# Patient Record
Sex: Male | Born: 1985
Health system: Southern US, Community
[De-identification: ages and names within clinical notes are randomized; demographics above are authoritative.]

## PROBLEM LIST (undated history)

## (undated) DIAGNOSIS — E119 Type 2 diabetes mellitus without complications: Secondary | ICD-10-CM

## (undated) HISTORY — PX: CLOSED REDUCTION HAND FRACTURE: SHX973

---
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
## 2017-08-30 ENCOUNTER — Ambulatory Visit (INDEPENDENT_AMBULATORY_CARE_PROVIDER_SITE_OTHER): Payer: 59 | Admitting: Physician Assistant

## 2017-08-30 ENCOUNTER — Encounter: Payer: Self-pay | Admitting: Physician Assistant

## 2017-08-30 VITALS — BP 126/88 | HR 72 | Temp 98.5°F | Resp 16 | Ht 66.0 in | Wt 183.0 lb

## 2017-08-30 DIAGNOSIS — Z131 Encounter for screening for diabetes mellitus: Secondary | ICD-10-CM

## 2017-08-30 DIAGNOSIS — R7309 Other abnormal glucose: Secondary | ICD-10-CM

## 2017-08-30 DIAGNOSIS — E119 Type 2 diabetes mellitus without complications: Secondary | ICD-10-CM | POA: Diagnosis not present

## 2017-08-30 MED ORDER — INSULIN GLARGINE 100 UNIT/ML SOLOSTAR PEN: 10.0000 [IU] | PEN_INJECTOR | Freq: Every day | SUBCUTANEOUS | 1 refills | Status: DC

## 2017-08-30 MED ORDER — INSULIN PEN NEEDLE 32G X 4 MM MISC: 0 refills | Status: DC

## 2017-08-30 NOTE — Progress Notes (Signed)
Patient: Garrett Morse Male    DOB: 16-Dec-1985   31 y.o.   MRN: [id number] Visit Date: 09/04/2017  Today's Provider: [name], PA-C   Chief Complaint  Patient presents with  . Establish Care  . Diabetes   Subjective:    Garrett Morse is a 32 y/o man presenting today to establish care. Has no prior PCP. He works with [employer]. Living in [place]. Mom and grandmother with Type II DM, mother diagnosed early thirties.  Main concern today is that two years ago he had surgery on his hand. He was told prior to surgery that his sugar was high and he needed insulin. He says this was not addressed further. He reports today that he experiences increased thirst and urination. Has frequent mood swings. He report he has lost weight, previously used to be 250 lbs two years ago. He does report his eating habits are not great, eats frequent fast food.   Diabetes  He presents for his follow-up diabetic visit. He has type 2 diabetes mellitus. There are no hypoglycemic associated symptoms. There are no diabetic associated symptoms. There are no hypoglycemic complications. Symptoms are stable. Risk factors for coronary artery disease include family history and male sex. Current diabetic treatment includes diet (Pt reports his highest weight was around 250 about three years ago. ). He is compliant with treatment all of the time. He is following a diabetic diet. He participates in exercise three times a week. An ACE inhibitor/angiotensin II receptor blocker is not being taken. He does not see a podiatrist.Eye exam is current.   Surgery 2016 march REX Mom, grandmother diabetes      Allergies  Allergen Reactions  . Penicillins Hives     Current Outpatient Medications:  .  fexofenadine (ALLEGRA ALLERGY) 180 MG tablet, Take 180 mg by mouth daily., Disp: , Rfl:  .  Insulin Glargine (LANTUS SOLOSTAR) 100 UNIT/ML Solostar Pen, Inject 10 Units into the skin daily at 10 pm., Disp: 5 pen,  Rfl: 1 .  Insulin Pen Needle 32G X 4 MM MISC, Use one needle per nightly injection, Disp: 90 each, Rfl: 0 .  metFORMIN (GLUCOPHAGE) 500 MG tablet, Take 500 mg daily x 1 wk. Take 500 mg 2x daily x 1 wk. Take 1000 mg in the morning and 500 mg at night x 1 wk. Then take 1000 mg 2x daily., Disp: 180 tablet, Rfl: 0  Review of Systems   12 point ROS reviewed and remainder of systems negative but for pertinent listed in HPI.   Social History   Tobacco Use  . Smoking status: Never Smoker  . Smokeless tobacco: Never Used  Substance Use Topics  . Alcohol use: Yes    Comment: Rarely   Objective:   BP 126/88 (BP Location: Left Arm, Patient Position: Sitting, Cuff Size: Normal)   Pulse 72   Temp 98.5 F (36.9 C) (Oral)   Resp 16   Ht 5\' 6"  (1.676 m)   Wt 183 lb (83 kg)   BMI 29.54 kg/m  Vitals:   08/30/17 1520  BP: 126/88  Pulse: 72  Resp: 16  Temp: 98.5 F (36.9 C)  TempSrc: Oral  Weight: 183 lb (83 kg)  Height: 5\' 6"  (1.676 m)     Physical Exam  Constitutional: He is oriented to person, place, and time. He appears well-developed and well-nourished.  HENT:  Head: Normocephalic and atraumatic.  Right Ear: External ear normal.  Left Ear: External ear normal.  Eyes: Pupils are equal, round, and reactive to light. Conjunctivae are normal.  Neck: Neck supple.  Cardiovascular: Normal rate and regular rhythm.  Pulmonary/Chest: Effort normal and breath sounds normal.  Abdominal: Soft. Bowel sounds are normal.  Lymphadenopathy:    He has no cervical adenopathy.  Neurological: He is alert and oriented to person, place, and time.  Skin: Skin is warm and dry.  Psychiatric: He has a normal mood and affect. His behavior is normal.        Assessment & Plan:     1. Diabetes mellitus screening  A1c today in office is >14 on our machine. Patient is not in distress, appears well. Suspect component of his weight loss due to elevated sugars. Unsure if he is late onset Type I or Type II  DM. Will start on 10 units of Lantus at night, demonstrated in the office and sample provided. Will get C-peptide to further assess and labs below. Have provided him hard script for glucometer, strips and lancets. Would like him to check fasting sugars daily and bring log to next visit.  2. Elevated glucose  - C-peptide - POCT HgB A1C  3. Type 2 diabetes mellitus without complication, without long-term current use of insulin (HCC)  - CBC With Differential - Lipid Profile - Comprehensive Metabolic Panel (CMET) - CBC with Differential - Urine Microalbumin w/creat. ratio - HgB A1c - Insulin Pen Needle 32G X 4 MM MISC; Use one needle per nightly injection  Dispense: 90 each; Refill: 0  Return in about 4 days (around 09/03/2017) for Type II DM.  The entirety of the information documented in the History of Present Illness, Review of Systems and Physical Exam were personally obtained by me. Portions of this information were initially documented by Kavin LeechLaura Walsh, CMA and reviewed by me for thoroughness and accuracy.   I have spent 45 minutes with this patient, >30% of which was spent on counseling and coordination of care.       Trey SailorsAdriana M Able Malloy, PA-C  [facility]

## 2017-08-30 NOTE — Patient Instructions (Signed)

## 2017-08-31 LAB — CBC WITH DIFFERENTIAL/PLATELET
Basophils Absolute: 0 10*3/uL (ref 0.0–0.2)
Basos: 1 %
EOS (ABSOLUTE): 0 10*3/uL (ref 0.0–0.4)
Eos: 1 %
Hematocrit: 43.9 % (ref 37.5–51.0)
Hemoglobin: 14.8 g/dL (ref 13.0–17.7)
Immature Grans (Abs): 0 10*3/uL (ref 0.0–0.1)
Immature Granulocytes: 0 %
Lymphocytes Absolute: 3.4 10*3/uL — ABNORMAL HIGH (ref 0.7–3.1)
Lymphs: 49 %
MCH: 29 pg (ref 26.6–33.0)
MCHC: 33.7 g/dL (ref 31.5–35.7)
MCV: 86 fL (ref 79–97)
Monocytes Absolute: 0.3 10*3/uL (ref 0.1–0.9)
Monocytes: 4 %
Neutrophils Absolute: 3 10*3/uL (ref 1.4–7.0)
Neutrophils: 45 %
Platelets: 312 10*3/uL (ref 150–379)
RBC: 5.1 x10E6/uL (ref 4.14–5.80)
RDW: 13.6 % (ref 12.3–15.4)
WBC: 6.8 10*3/uL (ref 3.4–10.8)

## 2017-08-31 LAB — LIPID PANEL
Chol/HDL Ratio: 4.9 ratio (ref 0.0–5.0)
Cholesterol, Total: 315 mg/dL — ABNORMAL HIGH (ref 100–199)
HDL: 64 mg/dL (ref >39)
LDL Calculated: 206 mg/dL — ABNORMAL HIGH (ref 0–99)
Triglycerides: 224 mg/dL — ABNORMAL HIGH (ref 0–149)
VLDL Cholesterol Cal: 45 mg/dL — ABNORMAL HIGH (ref 5–40)

## 2017-08-31 LAB — COMPREHENSIVE METABOLIC PANEL
ALT: 35 IU/L (ref 0–44)
AST: 32 IU/L (ref 0–40)
Albumin/Globulin Ratio: 1.4 (ref 1.2–2.2)
Albumin: 4.5 g/dL (ref 3.5–5.5)
Alkaline Phosphatase: 123 IU/L — ABNORMAL HIGH (ref 39–117)
BUN/Creatinine Ratio: 12 (ref 9–20)
BUN: 10 mg/dL (ref 6–20)
Bilirubin Total: 0.3 mg/dL (ref 0.0–1.2)
CO2: 23 mmol/L (ref 20–29)
Calcium: 9.5 mg/dL (ref 8.7–10.2)
Chloride: 95 mmol/L — ABNORMAL LOW (ref 96–106)
Creatinine, Ser: 0.86 mg/dL (ref 0.76–1.27)
GFR calc Af Amer: 135 mL/min/{1.73_m2} (ref >59)
GFR calc non Af Amer: 116 mL/min/{1.73_m2} (ref >59)
Globulin, Total: 3.2 g/dL (ref 1.5–4.5)
Glucose: 341 mg/dL — ABNORMAL HIGH (ref 65–99)
Potassium: 3.8 mmol/L (ref 3.5–5.2)
Sodium: 137 mmol/L (ref 134–144)
Total Protein: 7.7 g/dL (ref 6.0–8.5)

## 2017-08-31 LAB — MICROALBUMIN / CREATININE URINE RATIO
Creatinine, Urine: 57.3 mg/dL
Microalb/Creat Ratio: 5.8 mg/g creat (ref 0.0–30.0)
Microalbumin, Urine: 3.3 ug/mL

## 2017-08-31 LAB — HEMOGLOBIN A1C
Est. average glucose Bld gHb Est-mCnc: >398 mg/dL
Hgb A1c MFr Bld: >15.5 % — ABNORMAL HIGH (ref 4.8–5.6)

## 2017-08-31 LAB — C-PEPTIDE: C-Peptide: 1.1 ng/mL (ref 1.1–4.4)

## 2017-09-03 ENCOUNTER — Ambulatory Visit (INDEPENDENT_AMBULATORY_CARE_PROVIDER_SITE_OTHER): Payer: 59 | Admitting: Physician Assistant

## 2017-09-03 VITALS — BP 118/80 | HR 82 | Temp 98.4°F | Resp 14 | Wt 185.0 lb

## 2017-09-03 DIAGNOSIS — E1165 Type 2 diabetes mellitus with hyperglycemia: Secondary | ICD-10-CM | POA: Diagnosis not present

## 2017-09-03 DIAGNOSIS — E785 Hyperlipidemia, unspecified: Secondary | ICD-10-CM

## 2017-09-03 DIAGNOSIS — Z23 Encounter for immunization: Secondary | ICD-10-CM

## 2017-09-03 MED ORDER — METFORMIN HCL 500 MG PO TABS: ORAL_TABLET | ORAL | 0 refills | Status: DC

## 2017-09-03 NOTE — Patient Instructions (Addendum)
Dilated Diabetic Eye Exam at [facility]         Diabetes Mellitus and Exercise Exercising regularly is important for your overall health, especially when you have diabetes (diabetes mellitus). Exercising is not only about losing weight. It has many health benefits, such as increasing muscle strength and bone density and reducing body fat and stress. This leads to improved fitness, flexibility, and endurance, all of which result in better overall health. Exercise has additional benefits for people with diabetes, including:  Reducing appetite.  Helping to lower and control blood glucose.  Lowering blood pressure.  Helping to control amounts of fatty substances (lipids) in the blood, such as cholesterol and triglycerides.  Helping the body to respond better to insulin (improving insulin sensitivity).  Reducing how much insulin the body needs.  Decreasing the risk for heart disease by: ? Lowering cholesterol and triglyceride levels. ? Increasing the levels of good cholesterol. ? Lowering blood glucose levels.  What is my activity plan? Your health care provider or certified diabetes educator can help you make a plan for the type and frequency of exercise (activity plan) that works for you. Make sure that you:  Do at least 150 minutes of moderate-intensity or vigorous-intensity exercise each week. This could be brisk walking, biking, or water aerobics. ? Do stretching and strength exercises, such as yoga or weightlifting, at least 2 times a week. ? Spread out your activity over at least 3 days of the week.  Get some form of physical activity every day. ? Do not go more than 2 days in a row without some kind of physical activity. ? Avoid being inactive for more than 90 minutes at a time. Take frequent breaks to walk or stretch.  Choose a type of exercise or activity that you enjoy, and set realistic goals.  Start slowly, and gradually increase the intensity of your exercise  over time.  What do I need to know about managing my diabetes?  Check your blood glucose before and after exercising. ? If your blood glucose is higher than 240 mg/dL (16.1 mmol/L) before you exercise, check your urine for ketones. If you have ketones in your urine, do not exercise until your blood glucose returns to normal.  Know the symptoms of low blood glucose (hypoglycemia) and how to treat it. Your risk for hypoglycemia increases during and after exercise. Common symptoms of hypoglycemia can include: ? Hunger. ? Anxiety. ? Sweating and feeling clammy. ? Confusion. ? Dizziness or feeling light-headed. ? Increased heart rate or palpitations. ? Blurry vision. ? Tingling or numbness around the mouth, lips, or tongue. ? Tremors or shakes. ? Irritability.  Keep a rapid-acting carbohydrate snack available before, during, and after exercise to help prevent or treat hypoglycemia.  Avoid injecting insulin into areas of the body that are going to be exercised. For example, avoid injecting insulin into: ? The arms, when playing tennis. ? The legs, when jogging.  Keep records of your exercise habits. Doing this can help you and your health care provider adjust your diabetes management plan as needed. Write down: ? Food that you eat before and after you exercise. ? Blood glucose levels before and after you exercise. ? The type and amount of exercise you have done. ? When your insulin is expected to peak, if you use insulin. Avoid exercising at times when your insulin is peaking.  When you start a new exercise or activity, work with your health care provider to make sure the activity is  safe for you, and to adjust your insulin, medicines, or food intake as needed.  Drink plenty of water while you exercise to prevent dehydration or heat stroke. Drink enough fluid to keep your urine clear or pale yellow. This information is not intended to replace advice given to you by your health care  provider. Make sure you discuss any questions you have with your health care provider. Document Released: 07/22/2003 Document Revised: 11/19/2015 Document Reviewed: 10/11/2015 Elsevier Interactive Patient Education  2018 [employer]

## 2017-09-03 NOTE — Progress Notes (Signed)
Patient: Garrett Morse Male    DOB: 05/18/1985   31 y.o.   MRN: [id number] Visit Date: 09/03/2017  Today's Provider: Trey SailorsAdriana M Pollak, PA-C   Chief Complaint  Patient presents with  . Diabetes   Subjective:    HPI   Pt is a 32 y/o man is here today for a follow up of Diabetes. His last A1c was 08/30/17 and was greater than 14 on our in office machine, >15.5 on bloodwork. Pt reports that he is testing his blood sugar every morning fasting and this morning it was 220. He was started on 10 units of Lantus nightly at last visit. He denies any side effects and reports success with injection. He does need more needles for his lantus, he only got 4. He reports he is reducing his fast food in take and has started exercising. His C-peptide came back at 1.1. His cholesterol is elevated, with LDL >200. He has not had a pneumonia vaccination. He is not UTD on a tetanus vaccinations.      Allergies  Allergen Reactions  . Penicillins Hives     Current Outpatient Medications:  .  fexofenadine (ALLEGRA ALLERGY) 180 MG tablet, Take 180 mg by mouth daily., Disp: , Rfl:  .  Insulin Glargine (LANTUS SOLOSTAR) 100 UNIT/ML Solostar Pen, Inject 10 Units into the skin daily at 10 pm., Disp: 5 pen, Rfl: 1 .  Insulin Pen Needle 32G X 4 MM MISC, Use one needle per nightly injection, Disp: 90 each, Rfl: 0  Review of Systems  Constitutional: Negative.   HENT: Negative.   Eyes: Negative.   Respiratory: Negative.   Cardiovascular: Negative.   Gastrointestinal: Negative.   Endocrine: Negative.   Genitourinary: Negative.   Musculoskeletal: Negative.   Skin: Negative.   Allergic/Immunologic: Negative.   Neurological: Negative.   Hematological: Negative.   Psychiatric/Behavioral: Negative.     Social History   Tobacco Use  . Smoking status: Never Smoker  . Smokeless tobacco: Never Used  Substance Use Topics  . Alcohol use: Yes    Comment: Rarely   Objective:   BP 118/80 (BP Location:  Left Arm, Patient Position: Sitting, Cuff Size: Normal)   Pulse 82   Temp 98.4 F (36.9 C) (Oral)   Resp 14   Wt 185 lb (83.9 kg)   BMI 29.86 kg/m  Vitals:   09/03/17 1615  BP: 118/80  Pulse: 82  Resp: 14  Temp: 98.4 F (36.9 C)  TempSrc: Oral  Weight: 185 lb (83.9 kg)     Physical Exam  Constitutional: He is oriented to person, place, and time. He appears well-developed and well-nourished.  Cardiovascular: Normal rate and regular rhythm.  Pulmonary/Chest: Effort normal and breath sounds normal.  Neurological: He is alert and oriented to person, place, and time.  Skin: Skin is warm and dry.  Psychiatric: He has a normal mood and affect. His behavior is normal.        Assessment & Plan:     1. Type 2 diabetes mellitus with hyperglycemia, unspecified whether long term insulin use (HCC)  He is doing well with ten units Lantus nightly. He says his fastings are running 220. Now that C-peptide is back and is WNL, will initiate Metformin and see him in one month. Recommend checking fasting blood sugars daily with glucometer. He has been using one he had at home, counseled insurance may not pay for this particular one and to use script. Have sent in refills of  Lantus and needles. His urine microalbumin is normal, no need for ACEi/ARB. [facility] and is due for an exam. I have written down specific eye exam for diabetes and request that this facility send me the results of this. Counseled on yearly maintenance screenings for patients with diabetes.   - metFORMIN (GLUCOPHAGE) 500 MG tablet; Take 500 mg daily x 1 wk. Take 500 mg 2x daily x 1 wk. Take 1000 mg in the morning and 500 mg at night x 1 wk. Then take 1000 mg 2x daily.  Dispense: 180 tablet; Refill: 0  2. Hyperlipidemia, unspecified hyperlipidemia type  Very high, he will need a statin. Will recheck when his sugars are more controlled and plan on initiating statin.   3. Need for vaccination against Streptococcus  pneumoniae  Patient declines vaccination at this visit and elects to get it at next visit.   4. Need for Tdap vaccination  Patient declines vaccination at this visit and elects to get it at next visit.   Return in about 1 month (around 10/03/2017) for DM.  The entirety of the information documented in the History of Present Illness, Review of Systems and Physical Exam were personally obtained by me. Portions of this information were initially documented by Kavin Leech, CMA and reviewed by me for thoroughness and accuracy.           [name], PA-C  [facility]

## 2017-09-04 LAB — POCT GLYCOSYLATED HEMOGLOBIN (HGB A1C): Hemoglobin A1C: 14

## 2017-09-26 ENCOUNTER — Telehealth: Payer: Self-pay | Admitting: Physician Assistant

## 2017-09-26 DIAGNOSIS — E1165 Type 2 diabetes mellitus with hyperglycemia: Secondary | ICD-10-CM

## 2017-09-26 MED ORDER — BASAGLAR KWIKPEN 100 UNIT/ML ~~LOC~~ SOPN: PEN_INJECTOR | SUBCUTANEOUS | 1 refills | Status: DC

## 2017-09-26 NOTE — Telephone Encounter (Signed)
I have sent in Basaglar, which is similar to Lantus but it is the insulin his insurance approves. This will be going to his pharmacy. It is still 10 units injected subcutaneously once a night. We have one Basaglar U-100 Kwikpen in the sample refrigerator that he may have if he comes to pick it up.

## 2017-09-26 NOTE — Telephone Encounter (Signed)
Patient called about this, he is just wanting a sample if possible. He is also wanting to pick up around lunch time if he can. Please advise. Thanks!

## 2017-09-26 NOTE — Telephone Encounter (Signed)
Pt just got put on the insulin pin about a month ago and now it has ran out.  Pt either needs another pin or a refill from the pharmacy.  Pt was not sure of the name of the insulin  They use [facility] on [address].  Pt's call back is [phone]  Thanks teri

## 2017-09-27 NOTE — Telephone Encounter (Signed)
Pt advised.   Thanks,   -Dal Blew  

## 2017-10-05 ENCOUNTER — Ambulatory Visit: Payer: Self-pay | Admitting: Physician Assistant

## 2017-10-30 ENCOUNTER — Telehealth: Payer: Self-pay | Admitting: Physician Assistant

## 2017-10-30 ENCOUNTER — Ambulatory Visit: Payer: Self-pay | Admitting: Physician Assistant

## 2017-10-30 NOTE — Telephone Encounter (Signed)
This patient needs an office visit. His A1c is extremely high. He cancelled his last appointment and no showed this one. He should be informed of our dismissal policy. Please have him schedule a follow up.

## 2017-11-01 NOTE — Telephone Encounter (Signed)
Tried calling pt's number is disconnected.    Thanks,  -Laura  

## 2017-11-08 NOTE — Telephone Encounter (Signed)
Tried calling pt again. Number listed in demographics is invalid.

## 2017-11-09 ENCOUNTER — Other Ambulatory Visit: Payer: Self-pay | Admitting: Physician Assistant

## 2017-11-09 DIAGNOSIS — E1165 Type 2 diabetes mellitus with hyperglycemia: Secondary | ICD-10-CM

## 2017-11-09 NOTE — Telephone Encounter (Signed)
Pt needs refill on his Glargine 100 unit   [facility] uses [facility] [address]  Pt's CB  [phone]  Garrett Morse

## 2017-11-12 MED ORDER — BASAGLAR KWIKPEN 100 UNIT/ML ~~LOC~~ SOPN: PEN_INJECTOR | SUBCUTANEOUS | 0 refills | Status: DC

## 2017-11-12 NOTE — Telephone Encounter (Signed)
Tried calling work and home numbers.  There was no answer at either contact.   Thanks,   -Vernona RiegerLaura

## 2017-11-12 NOTE — Telephone Encounter (Signed)
No more refills until he follows up.

## 2017-11-12 NOTE — Telephone Encounter (Signed)
Pt has an office visit scheduled for 11/27/2017  Thanks,   -Vernona RiegerLaura

## 2017-11-27 ENCOUNTER — Telehealth: Payer: Self-pay | Admitting: Physician Assistant

## 2017-11-27 ENCOUNTER — Ambulatory Visit: Payer: 59 | Admitting: Physician Assistant

## 2017-11-27 NOTE — Telephone Encounter (Signed)
Called this patient at the [phone] about his missed appointment today. Says he forgot and would like to be rescheduled. Offered him 12:00 PM appointment on 11/28/2017 and he accepted. Can we please schedule him for twenty minutes at this slot?

## 2017-11-28 ENCOUNTER — Encounter: Payer: Self-pay | Admitting: Physician Assistant

## 2017-11-28 ENCOUNTER — Ambulatory Visit (INDEPENDENT_AMBULATORY_CARE_PROVIDER_SITE_OTHER): Payer: 59 | Admitting: Physician Assistant

## 2017-11-28 VITALS — BP 120/84 | HR 91 | Temp 98.8°F | Resp 16 | Wt 195.8 lb

## 2017-11-28 DIAGNOSIS — E1165 Type 2 diabetes mellitus with hyperglycemia: Secondary | ICD-10-CM

## 2017-11-28 DIAGNOSIS — E785 Hyperlipidemia, unspecified: Secondary | ICD-10-CM

## 2017-11-28 DIAGNOSIS — Z23 Encounter for immunization: Secondary | ICD-10-CM | POA: Diagnosis not present

## 2017-11-28 MED ORDER — SEMAGLUTIDE(0.25 OR 0.5MG/DOS) 2 MG/1.5ML ~~LOC~~ SOPN: 0.2500 mg | PEN_INJECTOR | SUBCUTANEOUS | 3 refills | Status: DC

## 2017-11-28 MED ORDER — METFORMIN HCL ER 500 MG PO TB24: 1000.0000 mg | ORAL_TABLET | Freq: Two times a day (BID) | ORAL | 0 refills | Status: DC

## 2017-11-28 NOTE — Patient Instructions (Signed)

## 2017-11-28 NOTE — Progress Notes (Signed)
Patient: Garrett Morse Male    DOB: 02/25/1986   32 y.o.   MRN: [id number] Visit Date: 11/29/2017  Today's Provider: Trey SailorsAdriana M Pollak, PA-C   Chief Complaint  Patient presents with  . Diabetes   Subjective:    HPI      Diabetes Mellitus Type II, Follow-up:   Lab Results  Component Value Date   HGBA1C 12.3 (A) 11/29/2017   HGBA1C 14 09/04/2017   HGBA1C >15.5 (H) 08/30/2017    Last seen for diabetes 2 months ago.  Management since then includes starting Metformin 500mg  and is currently taking 1000 mg BID. He is also currently taking 10 mg Basaglar nightly.  He reports excellent compliance with treatment. He is having side effects. Patient reports symptoms of diarrhea  from taking Metformin Current symptoms include none and have been stable. Home blood sugar records: fasting range: 220-230  Episodes of hypoglycemia? no   Current Insulin Regimen: basaglar kwikpen once at night Most Recent Eye Exam: 32yrs Weight trend: stable Prior visit with dietician: no Current diet: well balanced Current exercise: none  Pertinent Labs:    Component Value Date/Time   CHOL 315 (H) 08/30/2017 1608   TRIG 224 (H) 08/30/2017 1608   HDL 64 08/30/2017 1608   LDLCALC 206 (H) 08/30/2017 1608   CREATININE 0.86 08/30/2017 1608    Wt Readings from Last 3 Encounters:  11/28/17 195 lb 12.8 oz (88.8 kg)  09/03/17 185 lb (83.9 kg)  08/30/17 183 lb (83 kg)    ------------------------------------------------------------------------   Allergies  Allergen Reactions  . Penicillins Hives     Current Outpatient Medications:  .  fexofenadine (ALLEGRA ALLERGY) 180 MG tablet, Take 180 mg by mouth daily., Disp: , Rfl:  .  Insulin Glargine (BASAGLAR KWIKPEN) 100 UNIT/ML SOPN, Inject 10 units subcutaneously each night., Disp: 3 mL, Rfl: 0 .  Insulin Pen Needle 32G X 4 MM MISC, Use one needle per nightly injection, Disp: 90 each, Rfl: 0 .  metFORMIN (GLUCOPHAGE XR) 500 MG 24 hr tablet,  Take 2 tablets (1,000 mg total) by mouth 2 (two) times daily., Disp: 360 tablet, Rfl: 0 .  Semaglutide (OZEMPIC) 0.25 or 0.5 MG/DOSE SOPN, Inject 0.25 mg into the skin once a week., Disp: 1.5 mL, Rfl: 3  Review of Systems  Constitutional: Negative.   HENT: Negative.   Eyes: Negative.   Respiratory: Negative.   Cardiovascular: Negative.   Genitourinary: Negative.   Musculoskeletal: Negative.   Neurological: Negative.     Social History   Tobacco Use  . Smoking status: Never Smoker  . Smokeless tobacco: Never Used  Substance Use Topics  . Alcohol use: Yes    Comment: Rarely   Objective:   BP 120/84   Pulse 91   Temp 98.8 F (37.1 C) (Oral)   Resp 16   Wt 195 lb 12.8 oz (88.8 kg)   BMI 31.60 kg/m  Vitals:   11/28/17 1219  BP: 120/84  Pulse: 91  Resp: 16  Temp: 98.8 F (37.1 C)  TempSrc: Oral  Weight: 195 lb 12.8 oz (88.8 kg)     Physical Exam      Assessment & Plan:     1. Type 2 diabetes mellitus with hyperglycemia, unspecified whether long term insulin use (HCC)  His A1c has improved. Will change metformin to extended release in hopes of reducing GI side effects. Have started on ozempic with first dose in office see back in one month. He will need  statin therapy but do not want to start too many medications at that time. He is due for eye exam at [facility].   - Semaglutide (OZEMPIC) 0.25 or 0.5 MG/DOSE SOPN; Inject 0.25 mg into the skin once a week.  Dispense: 1.5 mL; Refill: 3 - metFORMIN (GLUCOPHAGE XR) 500 MG 24 hr tablet; Take 2 tablets (1,000 mg total) by mouth 2 (two) times daily.  Dispense: 360 tablet; Refill: 0 - POCT HgB A1C  2. Hyperlipidemia, unspecified hyperlipidemia type   3. Need for vaccination against Streptococcus pneumoniae  Next visit.   4. Need for Tdap vaccination  Next visit.   Return in about 1 month (around 12/29/2017).  The entirety of the information documented in the History of Present Illness, Review of Systems and  Physical Exam were personally obtained by me. Portions of this information were initially documented by Kavin Leech, CMA and reviewed by me for thoroughness and accuracy.        [name], PA-C  [facility]

## 2017-11-29 LAB — POCT GLYCOSYLATED HEMOGLOBIN (HGB A1C): Hemoglobin A1C: 12.3 % — AB (ref 4.0–5.6)

## 2018-01-04 ENCOUNTER — Encounter: Payer: Self-pay | Admitting: Physician Assistant

## 2018-01-04 ENCOUNTER — Ambulatory Visit (INDEPENDENT_AMBULATORY_CARE_PROVIDER_SITE_OTHER): Payer: 59 | Admitting: Physician Assistant

## 2018-01-04 VITALS — BP 106/60 | HR 76 | Temp 98.5°F | Resp 16 | Ht 66.0 in | Wt 196.0 lb

## 2018-01-04 DIAGNOSIS — E1165 Type 2 diabetes mellitus with hyperglycemia: Secondary | ICD-10-CM

## 2018-01-04 DIAGNOSIS — Z23 Encounter for immunization: Secondary | ICD-10-CM | POA: Diagnosis not present

## 2018-01-04 LAB — POCT GLYCOSYLATED HEMOGLOBIN (HGB A1C)
Est. average glucose Bld gHb Est-mCnc: 237
Hemoglobin A1C: 9.9 % — AB (ref 4.0–5.6)

## 2018-01-04 MED ORDER — SEMAGLUTIDE(0.25 OR 0.5MG/DOS) 2 MG/1.5ML ~~LOC~~ SOPN: 0.5000 mg | PEN_INJECTOR | SUBCUTANEOUS | 0 refills | Status: DC

## 2018-01-04 NOTE — Patient Instructions (Addendum)
Increase Ozempic to 0.5 mg once weekly  Take 8 units insulin tonight, 6 units tomorrow, 4 units the next day, 2 units the next if anything is left

## 2018-01-04 NOTE — Progress Notes (Signed)
Patient: Garrett Morse Male    DOB: November 09, 1985   32 y.o.   MRN: [id number] Visit Date: 01/04/2018  Today's Provider: [name], PA-C   Chief Complaint  Patient presents with  . Follow-up   Subjective:    HPI  Follow up for type 2 diabetes mellitus  The patient was last seen for this 4 weeks ago. Changes made at last visit include changes Metformin to ER to reduce GI side effects and he has done well with this. He was also started on ozempic 0.25 mg subcutaneously weekly. He is doing well with this. Some nausea in the beginning but resolved. Decreased appetite. He also continues with 10 mg basal insulin nightly.   He reports excellent compliance with treatment. He feels that condition is Improved. He is not having side effects.  Patient reports checking FBS and its around 120-150's.  Eye exam scheduled 9/14 at [facility].   Due for pneumonia and tdap today.   Lab Results  Component Value Date   HGBA1C 9.9 (A) 01/04/2018   Wt Readings from Last 3 Encounters:  01/04/18 196 lb (88.9 kg)  11/28/17 195 lb 12.8 oz (88.8 kg)  09/03/17 185 lb (83.9 kg)    ------------------------------------------------------------------------------------       Allergies  Allergen Reactions  . Penicillins Hives     Current Outpatient Medications:  .  fexofenadine (ALLEGRA ALLERGY) 180 MG tablet, Take 180 mg by mouth daily., Disp: , Rfl:  .  Insulin Glargine (BASAGLAR KWIKPEN) 100 UNIT/ML SOPN, Inject 10 units subcutaneously each night., Disp: 3 mL, Rfl: 0 .  Insulin Pen Needle 32G X 4 MM MISC, Use one needle per nightly injection, Disp: 90 each, Rfl: 0 .  metFORMIN (GLUCOPHAGE XR) 500 MG 24 hr tablet, Take 2 tablets (1,000 mg total) by mouth 2 (two) times daily., Disp: 360 tablet, Rfl: 0 .  Semaglutide (OZEMPIC) 0.25 or 0.5 MG/DOSE SOPN, Inject 0.25 mg into the skin once a week., Disp: 1.5 mL, Rfl: 3  Review of Systems  Constitutional: Negative.   Eyes: Negative.     Cardiovascular: Negative.   Endocrine: Negative.     Social History   Tobacco Use  . Smoking status: Never Smoker  . Smokeless tobacco: Never Used  Substance Use Topics  . Alcohol use: Yes    Comment: Rarely   Objective:   BP 106/60 (BP Location: Left Arm, Patient Position: Sitting, Cuff Size: Normal)   Pulse 76   Temp 98.5 F (36.9 C) (Oral)   Resp 16   Ht 5\' 6"  (1.676 m)   Wt 196 lb (88.9 kg)   SpO2 99%   BMI 31.64 kg/m  Vitals:   01/04/18 1037  BP: 106/60  Pulse: 76  Resp: 16  Temp: 98.5 F (36.9 C)  TempSrc: Oral  SpO2: 99%  Weight: 196 lb (88.9 kg)  Height: 5\' 6"  (1.676 m)     Physical Exam  Constitutional: He is oriented to person, place, and time. He appears well-developed and well-nourished.  Cardiovascular: Normal rate and regular rhythm.  Pulmonary/Chest: Effort normal and breath sounds normal.  Neurological: He is alert and oriented to person, place, and time.  Skin: Skin is warm and dry.  Psychiatric: He has a normal mood and affect. His behavior is normal.        Assessment & Plan:     1. Type 2 diabetes mellitus with hyperglycemia, unspecified whether long term insulin use (HCC)  A1c has improved greatly  to 9.9% down from 12.3% last month since starting Ozempic. Will have him increase to 0.5mg  subcutaneously once weekly. Will also have him taper off his basal insulin. Can do 8 units tonight, 6 units tomorrow, 4 units the next day, and 2 units the last day. Will see him back in one month for follow up. He is going to start exercise regimen.   - POCT glycosylated hemoglobin (Hb A1C)  2. Need for diphtheria-tetanus-pertussis (Tdap) vaccine  - Tdap vaccine greater than or equal to 7yo IM  3. Need for prophylactic vaccination against Streptococcus pneumoniae (pneumococcus)  - Pneumococcal polysaccharide vaccine 23-valent greater than or equal to 2yo subcutaneous/IM  Return in about 1 month (around 02/04/2018).  The entirety of the  information documented in the History of Present Illness, Review of Systems and Physical Exam were personally obtained by me. Portions of this information were initially documented by Rondel BatonSulibeya Dimas, CMA and reviewed by me for thoroughness and accuracy.        Trey SailorsAdriana M Pollak, PA-C  [facility]

## 2018-02-15 ENCOUNTER — Other Ambulatory Visit: Payer: Self-pay | Admitting: Physician Assistant

## 2018-02-15 DIAGNOSIS — E1165 Type 2 diabetes mellitus with hyperglycemia: Secondary | ICD-10-CM

## 2018-02-15 MED ORDER — SEMAGLUTIDE(0.25 OR 0.5MG/DOS) 2 MG/1.5ML ~~LOC~~ SOPN: 0.5000 mg | PEN_INJECTOR | SUBCUTANEOUS | 0 refills | Status: DC

## 2018-02-22 ENCOUNTER — Ambulatory Visit (INDEPENDENT_AMBULATORY_CARE_PROVIDER_SITE_OTHER): Payer: 59 | Admitting: Physician Assistant

## 2018-02-22 DIAGNOSIS — T753XXA Motion sickness, initial encounter: Secondary | ICD-10-CM

## 2018-02-22 DIAGNOSIS — E1165 Type 2 diabetes mellitus with hyperglycemia: Secondary | ICD-10-CM | POA: Diagnosis not present

## 2018-02-22 DIAGNOSIS — Z114 Encounter for screening for human immunodeficiency virus [HIV]: Secondary | ICD-10-CM

## 2018-02-22 LAB — POCT GLYCOSYLATED HEMOGLOBIN (HGB A1C)
Est. average glucose Bld gHb Est-mCnc: 183
Hemoglobin A1C: 8 % — AB (ref 4.0–5.6)

## 2018-02-22 MED ORDER — SEMAGLUTIDE (1 MG/DOSE) 2 MG/1.5ML ~~LOC~~ SOPN: 1.0000 mg | PEN_INJECTOR | SUBCUTANEOUS | 1 refills | Status: AC

## 2018-02-22 MED ORDER — SCOPOLAMINE 1 MG/3DAYS TD PT72: 1.0000 | MEDICATED_PATCH | TRANSDERMAL | 0 refills | Status: DC

## 2018-02-22 NOTE — Progress Notes (Signed)
Patient: Garrett Morse Male    DOB: [birth year]   32 y.o.   MRN: [id number] Visit Date: 02/28/2018  Today's Provider: Trinna Post, PA-C   Chief Complaint  Patient presents with  . Diabetes   Subjective:    HPI  Diabetes Mellitus Type II, Follow-up:   Lab Results  Component Value Date   HGBA1C 8.0 (A) 02/22/2018   HGBA1C 9.9 (A) 01/04/2018   HGBA1C 12.3 (A) 11/29/2017    Last seen for diabetes 6 weeks ago.  Management since then includes Ozempic increase 0.5 mg subcutanous once weekly in addition to Metformin 1000 mg BID. Insulin was tapered down and stopped completely. He reports excellent compliance with treatment. He is not having side effects except for occasional episode of nausea.  Current symptoms include none and have been stable. Home blood sugar records: fasting range: 120  Episodes of hypoglycemia? no   Current Insulin Regimen: as directed Most Recent Eye Exam: scheduling.  Weight trend: stable Prior visit with dietician: no Current diet: in general, a "healthy" diet   Current exercise: walking  Pertinent Labs:    Component Value Date/Time   CHOL 198 02/22/2018 0954   TRIG 96 02/22/2018 0954   HDL 61 02/22/2018 0954   LDLCALC 118 (H) 02/22/2018 0954   CREATININE 0.98 02/22/2018 0954    Wt Readings from Last 3 Encounters:  01/04/18 196 lb (88.9 kg)  11/28/17 195 lb 12.8 oz (88.8 kg)  09/03/17 185 lb (83.9 kg)   He is going on a cruise and is worried he might get motion sickness. ------------------------------------------------------------------------      Allergies  Allergen Reactions  . Penicillins Hives     Current Outpatient Medications:  .  fexofenadine (ALLEGRA ALLERGY) 180 MG tablet, Take 180 mg by mouth daily., Disp: , Rfl:  .  metFORMIN (GLUCOPHAGE XR) 500 MG 24 hr tablet, Take 2 tablets (1,000 mg total) by mouth 2 (two) times daily., Disp: 360 tablet, Rfl: 0 .  scopolamine (TRANSDERM-SCOP, 1.5 MG,) 1 MG/3DAYS, Place  1 patch (1.5 mg total) onto the skin every 3 (three) days., Disp: 10 patch, Rfl: 0 .  Semaglutide, 1 MG/DOSE, (OZEMPIC, 1 MG/DOSE,) 2 MG/1.5ML SOPN, Inject 1 mg into the skin once a week., Disp: 4.5 mL, Rfl: 1  Review of Systems  Constitutional: Negative.   HENT: Negative.   Eyes: Negative.   Respiratory: Negative.   Cardiovascular: Negative.   Endocrine: Negative.     Social History   Tobacco Use  . Smoking status: Never Smoker  . Smokeless tobacco: Never Used  Substance Use Topics  . Alcohol use: Yes    Comment: Rarely   Objective:   There were no vitals taken for this visit. There were no vitals filed for this visit.   Physical Exam  Constitutional: He is oriented to person, place, and time. He appears well-developed and well-nourished.  Cardiovascular: Normal rate and regular rhythm.  Pulmonary/Chest: Effort normal and breath sounds normal.  Neurological: He is alert and oriented to person, place, and time.  Skin: Skin is warm and dry.  Psychiatric: He has a normal mood and affect. His behavior is normal.        Assessment & Plan:     1. Type 2 diabetes mellitus with hyperglycemia, unspecified whether long term insulin use (HCC)  A1c has vastly improved, 8% at this visit, down from 9.9% last visit and overall from 15% when I met him. We will increase his ozempic to  1 mg subQ once weekly and have him follow up in three months. Needs to schedule eye exam. We will be checking his cholesterol labs.   - POCT glycosylated hemoglobin (Hb A1C) - Semaglutide, 1 MG/DOSE, (OZEMPIC, 1 MG/DOSE,) 2 MG/1.5ML SOPN; Inject 1 mg into the skin once a week.  Dispense: 4.5 mL; Refill: 1 - Lipid Profile - Comprehensive Metabolic Panel (CMET)  2. Motion sickness, initial encounter  - scopolamine (TRANSDERM-SCOP, 1.5 MG,) 1 MG/3DAYS; Place 1 patch (1.5 mg total) onto the skin every 3 (three) days.  Dispense: 10 patch; Refill: 0  3. Encounter for screening for HIV  - HIV antibody  (with reflex)  Return in about 3 months (around 05/25/2018) for dm and cpe.  The entirety of the information documented in the History of Present Illness, Review of Systems and Physical Exam were personally obtained by me. Portions of this information were initially documented by Lynford Humphrey, CMA and reviewed by me for thoroughness and accuracy.        Trinna Post, PA-C  [facility]

## 2018-02-23 LAB — COMPREHENSIVE METABOLIC PANEL
ALT: 25 IU/L (ref 0–44)
AST: 22 IU/L (ref 0–40)
Albumin/Globulin Ratio: 1.5 (ref 1.2–2.2)
Albumin: 4.5 g/dL (ref 3.5–5.5)
Alkaline Phosphatase: 70 IU/L (ref 39–117)
BUN/Creatinine Ratio: 12 (ref 9–20)
BUN: 12 mg/dL (ref 6–20)
Bilirubin Total: 0.3 mg/dL (ref 0.0–1.2)
CO2: 23 mmol/L (ref 20–29)
Calcium: 9.4 mg/dL (ref 8.7–10.2)
Chloride: 101 mmol/L (ref 96–106)
Creatinine, Ser: 0.98 mg/dL (ref 0.76–1.27)
GFR calc Af Amer: 118 mL/min/{1.73_m2} (ref >59)
GFR calc non Af Amer: 102 mL/min/{1.73_m2} (ref >59)
Globulin, Total: 3 g/dL (ref 1.5–4.5)
Glucose: 141 mg/dL — ABNORMAL HIGH (ref 65–99)
Potassium: 4 mmol/L (ref 3.5–5.2)
Sodium: 140 mmol/L (ref 134–144)
Total Protein: 7.5 g/dL (ref 6.0–8.5)

## 2018-02-23 LAB — LIPID PANEL
Chol/HDL Ratio: 3.2 ratio (ref 0.0–5.0)
Cholesterol, Total: 198 mg/dL (ref 100–199)
HDL: 61 mg/dL (ref >39)
LDL Calculated: 118 mg/dL — ABNORMAL HIGH (ref 0–99)
Triglycerides: 96 mg/dL (ref 0–149)
VLDL Cholesterol Cal: 19 mg/dL (ref 5–40)

## 2018-02-23 LAB — HIV ANTIBODY (ROUTINE TESTING W REFLEX): HIV Screen 4th Generation wRfx: NONREACTIVE

## 2018-02-27 ENCOUNTER — Telehealth: Payer: Self-pay

## 2018-02-27 NOTE — Telephone Encounter (Signed)
Pt returned call ° °teri °

## 2018-02-27 NOTE — Telephone Encounter (Signed)
-----   Message from Adriana M Pollak, PA-C sent at 02/26/2018  1:55 PM EDT ----- His cholesterol is much improved now that his sugar has come down. We can hold off on medication and recheck cholesterol at follow up. CMET stable, HIV negative, and A1c much improved. 

## 2018-02-27 NOTE — Telephone Encounter (Signed)
Patient was advised.,PC 

## 2018-02-27 NOTE — Telephone Encounter (Signed)
LVMTRC.,PC 

## 2018-02-28 ENCOUNTER — Telehealth: Payer: Self-pay

## 2018-02-28 ENCOUNTER — Encounter: Payer: Self-pay | Admitting: Physician Assistant

## 2018-02-28 NOTE — Telephone Encounter (Signed)
Patient advised as below.  

## 2018-02-28 NOTE — Telephone Encounter (Signed)
-----   Message from [name], [place] sent at 02/26/2018  1:55 PM EDT ----- His cholesterol is much improved now that his sugar has come down. We can hold off on medication and recheck cholesterol at follow up. CMET stable, HIV negative, and A1c much improved.

## 2018-02-28 NOTE — Telephone Encounter (Signed)
-----   Message from Adriana M Pollak, PA-C sent at 02/26/2018  1:55 PM EDT ----- His cholesterol is much improved now that his sugar has come down. We can hold off on medication and recheck cholesterol at follow up. CMET stable, HIV negative, and A1c much improved. 

## 2018-02-28 NOTE — Patient Instructions (Signed)

## 2018-05-29 ENCOUNTER — Telehealth: Payer: Self-pay | Admitting: Physician Assistant

## 2018-05-29 DIAGNOSIS — E1165 Type 2 diabetes mellitus with hyperglycemia: Secondary | ICD-10-CM

## 2018-05-29 MED ORDER — SEMAGLUTIDE (1 MG/DOSE) 2 MG/1.5ML ~~LOC~~ SOPN: 1.0000 mg | PEN_INJECTOR | SUBCUTANEOUS | 0 refills | Status: DC

## 2018-05-29 NOTE — Telephone Encounter (Signed)
Sent to [facility]

## 2018-05-29 NOTE — Telephone Encounter (Signed)
Patient is out of Ozempic and needs new Rx sent to [facility]  Call back #  [phone]

## 2018-05-31 ENCOUNTER — Encounter: Payer: Self-pay | Admitting: Physician Assistant

## 2018-05-31 ENCOUNTER — Ambulatory Visit (INDEPENDENT_AMBULATORY_CARE_PROVIDER_SITE_OTHER): Payer: No Typology Code available for payment source | Admitting: Physician Assistant

## 2018-05-31 VITALS — BP 118/82 | HR 78 | Temp 98.7°F | Resp 16 | Ht 66.0 in | Wt 202.0 lb

## 2018-05-31 DIAGNOSIS — E785 Hyperlipidemia, unspecified: Secondary | ICD-10-CM | POA: Diagnosis not present

## 2018-05-31 DIAGNOSIS — E119 Type 2 diabetes mellitus without complications: Secondary | ICD-10-CM

## 2018-05-31 DIAGNOSIS — E1165 Type 2 diabetes mellitus with hyperglycemia: Secondary | ICD-10-CM

## 2018-05-31 LAB — POCT GLYCOSYLATED HEMOGLOBIN (HGB A1C)
Est. average glucose Bld gHb Est-mCnc: 197
Hemoglobin A1C: 8.5 % — AB (ref 4.0–5.6)

## 2018-05-31 MED ORDER — DAPAGLIFLOZIN PROPANEDIOL 5 MG PO TABS: 5.0000 mg | ORAL_TABLET | Freq: Every day | ORAL | 0 refills | Status: AC

## 2018-05-31 NOTE — Progress Notes (Signed)
Patient: Garrett Morse Male    DOB: 06/25/1985   33 y.o.   MRN: [id number] Visit Date: 06/13/2018  Today's Provider: Trey SailorsAdriana M Stanford Strauch, PA-C   Chief Complaint  Patient presents with  . Diabetes   Subjective:     HPI  Diabetes Mellitus Type II, Follow-up:   Lab Results  Component Value Date   HGBA1C 8.5 (A) 05/31/2018   HGBA1C 8.0 (A) 02/22/2018   HGBA1C 9.9 (A) 01/04/2018    Wt Readings from Last 3 Encounters:  05/31/18 202 lb (91.6 kg)  01/04/18 196 lb (88.9 kg)  11/28/17 195 lb 12.8 oz (88.8 kg)    Last seen for diabetes 3 months ago. Has not taken metformin in one week Management since then includes no changes. He reports excellent compliance with treatment. He is not having side effects.  Current symptoms include none and have been stable. Home blood sugar records: fasting range: 140's  [facility]: scheduling eye exam  Episodes of hypoglycemia? no   Current Insulin Regimen:  Most Recent Eye Exam:  Weight trend: stable Prior visit with dietician: no Current diet: in general, a "healthy" diet  . Avoiding fried foods, cutting out milk and bread. No juice or soda. Half a cookie from [employer] - other half the next  Current exercise: walking  Pertinent Labs:    Component Value Date/Time   CHOL 198 02/22/2018 0954   TRIG 96 02/22/2018 0954   HDL 61 02/22/2018 0954   LDLCALC 118 (H) 02/22/2018 0954   CREATININE 0.98 02/22/2018 0954    Wt Readings from Last 3 Encounters:  05/31/18 202 lb (91.6 kg)  01/04/18 196 lb (88.9 kg)  11/28/17 195 lb 12.8 oz (88.8 kg)    ------------------------------------------------------------------------   Allergies  Allergen Reactions  . Penicillins Hives     Current Outpatient Medications:  .  Semaglutide, 1 MG/DOSE, (OZEMPIC, 1 MG/DOSE,) 2 MG/1.5ML SOPN, Inject 1 mg into the skin once a week., Disp: 9 mL, Rfl: 0 .  dapagliflozin propanediol (FARXIGA) 5 MG TABS tablet, Take 5 mg by mouth daily., Disp: 450 mg,  Rfl: 0 .  fexofenadine (ALLEGRA ALLERGY) 180 MG tablet, Take 180 mg by mouth daily., Disp: , Rfl:  .  metFORMIN (GLUCOPHAGE XR) 500 MG 24 hr tablet, Take 2 tablets (1,000 mg total) by mouth 2 (two) times daily., Disp: 360 tablet, Rfl: 0  Review of Systems  Constitutional: Negative.   Cardiovascular: Negative.   Endocrine: Negative.     Social History   Tobacco Use  . Smoking status: Never Smoker  . Smokeless tobacco: Never Used  Substance Use Topics  . Alcohol use: Yes    Comment: Rarely      Objective:   BP 118/82 (BP Location: Left Arm, Patient Position: Sitting, Cuff Size: Normal)   Pulse 78   Temp 98.7 F (37.1 C) (Oral)   Resp 16   Ht 5\' 6"  (1.676 m)   Wt 202 lb (91.6 kg)   BMI 32.60 kg/m  Vitals:   05/31/18 1208  BP: 118/82  Pulse: 78  Resp: 16  Temp: 98.7 F (37.1 C)  TempSrc: Oral  Weight: 202 lb (91.6 kg)  Height: 5\' 6"  (1.676 m)     Physical Exam      Assessment & Plan    1. Type 2 diabetes mellitus with hyperglycemia, unspecified whether long term insulin use (HCC)  - POCT glycosylated hemoglobin (Hb A1C)  2. Type 2 diabetes mellitus without complication, without long-term  current use of insulin (HCC)  Uncontrolled, add farxiga.   - dapagliflozin propanediol (FARXIGA) 5 MG TABS tablet; Take 5 mg by mouth daily.  Dispense: 450 mg; Refill: 0  3. Hyperlipidemia, unspecified hyperlipidemia type  The entirety of the information documented in the History of Present Illness, Review of Systems and Physical Exam were personally obtained by me. Portions of this information were initially documented by Rondel Baton, CMA and reviewed by me for thoroughness and accuracy.   Return in about 3 months (around 08/30/2018) for DM.       [name], PA-C  [facility]

## 2018-07-12 ENCOUNTER — Other Ambulatory Visit: Payer: Self-pay | Admitting: Physician Assistant

## 2018-07-12 DIAGNOSIS — E1165 Type 2 diabetes mellitus with hyperglycemia: Secondary | ICD-10-CM

## 2018-07-12 MED ORDER — SEMAGLUTIDE (1 MG/DOSE) 2 MG/1.5ML ~~LOC~~ SOPN: 1.0000 mg | PEN_INJECTOR | SUBCUTANEOUS | 0 refills | Status: DC

## 2018-08-26 ENCOUNTER — Other Ambulatory Visit: Payer: Self-pay | Admitting: Physician Assistant

## 2018-08-26 DIAGNOSIS — E1165 Type 2 diabetes mellitus with hyperglycemia: Secondary | ICD-10-CM

## 2018-08-26 MED ORDER — SEMAGLUTIDE (1 MG/DOSE) 2 MG/1.5ML ~~LOC~~ SOPN: 1.0000 mg | PEN_INJECTOR | SUBCUTANEOUS | 0 refills | Status: DC

## 2018-08-26 NOTE — Telephone Encounter (Signed)
Can we offer him drive by T0G with e-visit please for upcoming 08/30/2018 appointment. Thank you.

## 2018-08-30 ENCOUNTER — Ambulatory Visit (INDEPENDENT_AMBULATORY_CARE_PROVIDER_SITE_OTHER): Payer: No Typology Code available for payment source | Admitting: Physician Assistant

## 2018-08-30 ENCOUNTER — Ambulatory Visit: Payer: Self-pay

## 2018-08-30 DIAGNOSIS — E119 Type 2 diabetes mellitus without complications: Secondary | ICD-10-CM

## 2018-08-30 DIAGNOSIS — Z9109 Other allergy status, other than to drugs and biological substances: Secondary | ICD-10-CM | POA: Diagnosis not present

## 2018-08-30 DIAGNOSIS — E1165 Type 2 diabetes mellitus with hyperglycemia: Secondary | ICD-10-CM | POA: Diagnosis not present

## 2018-08-30 DIAGNOSIS — E785 Hyperlipidemia, unspecified: Secondary | ICD-10-CM

## 2018-08-30 LAB — POCT GLYCOSYLATED HEMOGLOBIN (HGB A1C): Hemoglobin A1C: 8.4 % — AB (ref 4.0–5.6)

## 2018-08-30 MED ORDER — DAPAGLIFLOZIN PROPANEDIOL 10 MG PO TABS: 10.0000 mg | ORAL_TABLET | Freq: Every day | ORAL | 0 refills | Status: AC

## 2018-08-30 MED ORDER — FLUTICASONE PROPIONATE 50 MCG/ACT NA SUSP: 2.0000 | Freq: Every day | NASAL | 6 refills | Status: DC

## 2018-08-30 MED ORDER — LEVOCETIRIZINE DIHYDROCHLORIDE 5 MG PO TABS: 5.0000 mg | ORAL_TABLET | Freq: Every evening | ORAL | 0 refills | Status: DC

## 2018-08-30 NOTE — Chronic Care Management (AMB) (Signed)
  Care Management   Note  08/30/2018 Name: Caeleb Nazario MRN: [id number] DOB: [birth year]  Hassel Tetterton is a 33 year old male who sees Osvaldo Angst PA-C for primary care. Ms. Jodi Marble asked the CCM team to consult the patient for care management related to DM education needs. Patient has a history of but not limited to DM. Referral was placed 08/30/2018 during office visit.  Telephone outreach to patient today to introduce CCM services.  Mr. Pasternack was given information about Care Management services today including:  1. Case Management services include personalized support from designated clinical staff supervised by a physician, including individualized plan of care and coordination with other care providers 2. 24/7 contact phone numbers for assistance for urgent and routine care needs. 3. The patient may stop CCM services at any time (effective at the end of the month) by phone call to the office staff.   Patient agreed to services and verbal consent obtained.     Plan: CCM RN CM attempted to contact patient to schedule initial assessment after computer system went down before scheduling. Unfortunately had to leave HIPAA Compliant VM message.  Will follow up next week for scheduling  Mai Longnecker E. Suzie Portela, RN, BSN Nurse Care Coordinator [facility] Care Management [phone]

## 2018-08-30 NOTE — Progress Notes (Signed)
Patient: Garrett Morse Male    DOB: 08/15/1985   33 y.o.   MRN: [id number] Visit Date: 09/03/2018  Today's Provider: Trey SailorsAdriana M Pollak, PA-C   Chief Complaint  Patient presents with  . Diabetes   Subjective:    Virtual Visit via Video Note  I connected with Garrett Albeerrick Lasser on 09/03/18 at  9:40 AM EDT by a video enabled telemedicine application and verified that I am speaking with the correct person using two identifiers.   I discussed the limitations of evaluation and management by telemedicine and the availability of in person appointments. The patient expressed understanding and agreed to proceed.   HPI   Patient presents today for 3 month follow up of diabetes. He is intolerant to metformin due to constant diarrhea experienced with both regular and extended release metformin. He has since been discontinued on this medication. He is currently taking Ozempic 1 mg weekly and was started on Farxiga 5 mg daily at last visit for better glycemia control and some weight loss. He reports he is taking the ozempic weekly consistently but may be missing some farxiga doses. Reports the farxiga is making him urinate more frequently. He is due for an eye exam. He is due for a foot exam and urine microalbumin. He reports he is not eating the best, especially with recent stay at home orders and being home more often.   Lab Results  Component Value Date   HGBA1C 8.4 (A) 08/30/2018    Wt Readings from Last 3 Encounters:  05/31/18 202 lb (91.6 kg)  01/04/18 196 lb (88.9 kg)  11/28/17 195 lb 12.8 oz (88.8 kg)   BP Readings from Last 4 Encounters:  05/31/18 118/82  01/04/18 106/60  11/28/17 120/84  09/03/17 118/80    Allergies  Allergen Reactions  . Penicillins Hives     Current Outpatient Medications:  .  fexofenadine (ALLEGRA ALLERGY) 180 MG tablet, Take 180 mg by mouth daily., Disp: , Rfl:  .  Semaglutide, 1 MG/DOSE, (OZEMPIC, 1 MG/DOSE,) 2 MG/1.5ML SOPN, Inject 1 mg into  the skin once a week., Disp: 9 mL, Rfl: 0 .  dapagliflozin propanediol (FARXIGA) 10 MG TABS tablet, Take 10 mg by mouth daily., Disp: 900 mg, Rfl: 0 .  fluticasone (FLONASE) 50 MCG/ACT nasal spray, Place 2 sprays into both nostrils daily., Disp: 16 g, Rfl: 6 .  levocetirizine (XYZAL) 5 MG tablet, Take 1 tablet (5 mg total) by mouth every evening., Disp: 90 tablet, Rfl: 0  Review of Systems  All other systems reviewed and are negative.   Social History   Tobacco Use  . Smoking status: Never Smoker  . Smokeless tobacco: Never Used  Substance Use Topics  . Alcohol use: Yes    Comment: Rarely      Objective:   There were no vitals taken for this visit. There were no vitals filed for this visit.   Physical Exam Constitutional:      Appearance: Normal appearance.  Cardiovascular:     Rate and Rhythm: Normal rate and regular rhythm.  Neurological:     Mental Status: He is alert. Mental status is at baseline.  Psychiatric:        Mood and Affect: Mood normal.        Behavior: Behavior normal.         Assessment & Plan    1. Diabetes mellitus without complication (HCC)  A1c 8.4 and remains uncontrolled. Will increase farxiga to 10 mg daily.  May need additional agents. Very critical that patient works on weight loss and reducing sugars in diet, will refer to CCM for assistance of this. He has been instructed to schedule eye exam. Will need to get foot exam and urine micro at next visit.   - POCT HgB A1C - Ambulatory referral to Chronic Care Management Services - dapagliflozin propanediol (FARXIGA) 10 MG TABS tablet; Take 10 mg by mouth daily.  Dispense: 900 mg; Refill: 0  2. Type 2 diabetes mellitus with hyperglycemia, unspecified whether long term insulin use (HCC)  - Ambulatory referral to Chronic Care Management Services  3. Environmental allergies  - levocetirizine (XYZAL) 5 MG tablet; Take 1 tablet (5 mg total) by mouth every evening.  Dispense: 90 tablet; Refill: 0  - fluticasone (FLONASE) 50 MCG/ACT nasal spray; Place 2 sprays into both nostrils daily.  Dispense: 16 g; Refill: 6  The entirety of the information documented in the History of Present Illness, Review of Systems and Physical Exam were personally obtained by me. Portions of this information were initially documented by Lexine Baton, LPN and reviewed by me for thoroughness and accuracy.    F/u 3 mo for DM II   Patient location: home Provider location: [facility]/home office  Persons involved in the visit: patient, provider      [name], PA-C  [facility]

## 2018-09-03 NOTE — Patient Instructions (Signed)
Diabetes Mellitus and Exercise Exercising regularly is important for your overall health, especially when you have diabetes (diabetes mellitus). Exercising is not only about losing weight. It has many other health benefits, such as increasing muscle strength and bone density and reducing body fat and stress. This leads to improved fitness, flexibility, and endurance, all of which result in better overall health. Exercise has additional benefits for people with diabetes, including:  Reducing appetite.  Helping to lower and control blood glucose.  Lowering blood pressure.  Helping to control amounts of fatty substances (lipids) in the blood, such as cholesterol and triglycerides.  Helping the body to respond better to insulin (improving insulin sensitivity).  Reducing how much insulin the body needs.  Decreasing the risk for heart disease by: ? Lowering cholesterol and triglyceride levels. ? Increasing the levels of good cholesterol. ? Lowering blood glucose levels. What is my activity plan? Your health care provider or certified diabetes educator can help you make a plan for the type and frequency of exercise (activity plan) that works for you. Make sure that you:  Do at least 150 minutes of moderate-intensity or vigorous-intensity exercise each week. This could be brisk walking, biking, or water aerobics. ? Do stretching and strength exercises, such as yoga or weightlifting, at least 2 times a week. ? Spread out your activity over at least 3 days of the week.  Get some form of physical activity every day. ? Do not go more than 2 days in a row without some kind of physical activity. ? Avoid being inactive for more than 30 minutes at a time. Take frequent breaks to walk or stretch.  Choose a type of exercise or activity that you enjoy, and set realistic goals.  Start slowly, and gradually increase the intensity of your exercise over time. What do I need to know about managing my  diabetes?   Check your blood glucose before and after exercising. ? If your blood glucose is 240 mg/dL (13.3 mmol/L) or higher before you exercise, check your urine for ketones. If you have ketones in your urine, do not exercise until your blood glucose returns to normal. ? If your blood glucose is 100 mg/dL (5.6 mmol/L) or lower, eat a snack containing 15-20 grams of carbohydrate. Check your blood glucose 15 minutes after the snack to make sure that your level is above 100 mg/dL (5.6 mmol/L) before you start your exercise.  Know the symptoms of low blood glucose (hypoglycemia) and how to treat it. Your risk for hypoglycemia increases during and after exercise. Common symptoms of hypoglycemia can include: ? Hunger. ? Anxiety. ? Sweating and feeling clammy. ? Confusion. ? Dizziness or feeling light-headed. ? Increased heart rate or palpitations. ? Blurry vision. ? Tingling or numbness around the mouth, lips, or tongue. ? Tremors or shakes. ? Irritability.  Keep a rapid-acting carbohydrate snack available before, during, and after exercise to help prevent or treat hypoglycemia.  Avoid injecting insulin into areas of the body that are going to be exercised. For example, avoid injecting insulin into: ? The arms, when playing tennis. ? The legs, when jogging.  Keep records of your exercise habits. Doing this can help you and your health care provider adjust your diabetes management plan as needed. Write down: ? Food that you eat before and after you exercise. ? Blood glucose levels before and after you exercise. ? The type and amount of exercise you have done. ? When your insulin is expected to peak, if you use   insulin. Avoid exercising at times when your insulin is peaking.  When you start a new exercise or activity, work with your health care provider to make sure the activity is safe for you, and to adjust your insulin, medicines, or food intake as needed.  Drink plenty of water while  you exercise to prevent dehydration or heat stroke. Drink enough fluid to keep your urine clear or pale yellow. Summary  Exercising regularly is important for your overall health, especially when you have diabetes (diabetes mellitus).  Exercising has many health benefits, such as increasing muscle strength and bone density and reducing body fat and stress.  Your health care provider or certified diabetes educator can help you make a plan for the type and frequency of exercise (activity plan) that works for you.  When you start a new exercise or activity, work with your health care provider to make sure the activity is safe for you, and to adjust your insulin, medicines, or food intake as needed. This information is not intended to replace advice given to you by your health care provider. Make sure you discuss any questions you have with your health care provider. Document Released: 07/22/2003 Document Revised: 11/09/2016 Document Reviewed: 10/11/2015 Elsevier Interactive Patient Education  2019 [employer]  

## 2018-09-09 ENCOUNTER — Ambulatory Visit: Payer: Self-pay

## 2018-09-09 DIAGNOSIS — E785 Hyperlipidemia, unspecified: Secondary | ICD-10-CM

## 2018-09-09 DIAGNOSIS — E1165 Type 2 diabetes mellitus with hyperglycemia: Secondary | ICD-10-CM

## 2018-09-09 NOTE — Chronic Care Management (AMB) (Signed)
   Care Management   Unsuccessful Call Note 09/09/2018 Name: Kaihan Dimarco MRN: [id number] DOB: [birth year]  Kadeyn Tsung is a 33 year old malewho sees Osvaldo Angst PA-C for primary care. [name] the CCM team to consult the patient for care management related to DM education needs. Patient has a history of but not limited to DM. Referral was placed4/17/2020 during office visit. Patient consented to services on 08/30/2018 but unable to schedule initial telephone assessment.   Was unable to reach patient via telephone for CCM RN CM initial assessment appointment. I have left HIPAA compliant voicemail asking patient to return my call. (unsuccessful outreach #[phone]).   Plan: Will follow-up within 7 business days via telephone.    Jenascia Bumpass E. Suzie Portela, RN, BSN Nurse Care Coordinator [facility] Care Management [phone]

## 2018-09-10 ENCOUNTER — Other Ambulatory Visit: Payer: Self-pay

## 2018-09-10 ENCOUNTER — Encounter: Payer: Self-pay | Admitting: Emergency Medicine

## 2018-09-10 ENCOUNTER — Emergency Department
Admission: EM | Admit: 2018-09-10 | Discharge: 2018-09-10 | Disposition: A | Discharge status: Home / Self Care | Payer: No Typology Code available for payment source | Attending: Emergency Medicine | Admitting: Emergency Medicine

## 2018-09-10 ENCOUNTER — Telehealth: Payer: Self-pay | Admitting: Physician Assistant

## 2018-09-10 ENCOUNTER — Emergency Department: Payer: No Typology Code available for payment source

## 2018-09-10 DIAGNOSIS — R079 Chest pain, unspecified: Secondary | ICD-10-CM | POA: Diagnosis not present

## 2018-09-10 LAB — BASIC METABOLIC PANEL
Anion gap: 8 (ref 5–15)
BUN: 13 mg/dL (ref 6–20)
CO2: 26 mmol/L (ref 22–32)
Calcium: 9.2 mg/dL (ref 8.9–10.3)
Chloride: 103 mmol/L (ref 98–111)
Creatinine, Ser: 0.97 mg/dL (ref 0.61–1.24)
GFR calc Af Amer: >60 mL/min (ref >60)
GFR calc non Af Amer: >60 mL/min (ref >60)
Glucose, Bld: 147 mg/dL — ABNORMAL HIGH (ref 70–99)
Potassium: 3.6 mmol/L (ref 3.5–5.1)
Sodium: 137 mmol/L (ref 135–145)

## 2018-09-10 LAB — CBC
HCT: 43.2 % (ref 39.0–52.0)
Hemoglobin: 14.3 g/dL (ref 13.0–17.0)
MCH: 28.2 pg (ref 26.0–34.0)
MCHC: 33.1 g/dL (ref 30.0–36.0)
MCV: 85.2 fL (ref 80.0–100.0)
Platelets: 309 10*3/uL (ref 150–400)
RBC: 5.07 MIL/uL (ref 4.22–5.81)
RDW: 12.7 % (ref 11.5–15.5)
WBC: 5.4 10*3/uL (ref 4.0–10.5)
nRBC: 0 % (ref 0.0–0.2)

## 2018-09-10 LAB — TROPONIN I: Troponin I: <0.03 ng/mL (ref <0.03)

## 2018-09-10 NOTE — Telephone Encounter (Signed)
Patient did go to ED.  

## 2018-09-10 NOTE — ED Notes (Signed)
Pt to Xray.

## 2018-09-10 NOTE — Telephone Encounter (Signed)
Called saying he has been having chest pains on his left side on and off since last night around 10:30.  Also left shoulder pain.  Spoke with Kennyth Arnold and she suggested he go to the ER.  Thanks [employer]

## 2018-09-10 NOTE — ED Provider Notes (Signed)
[facility] Emergency Department Provider Note       Time seen: ----------------------------------------- 9:43 AM on 09/10/2018 -----------------------------------------   I have reviewed the triage vital signs and the nursing notes.  HISTORY   Chief Complaint Chest Pain    HPI Garrett Morse is a 33 y.o. male with no significant past medical history who presents to the ED for left-sided chest pain that started last night.  Patient reports the pain as tightness and cramping sensation.  States the pain is intermittent.  He has never had it before, pain is currently 4 out of 10.  History reviewed. No pertinent past medical history.  There are no active problems to display for this patient.   Past Surgical History:  Procedure Laterality Date  . CLOSED REDUCTION HAND FRACTURE Left   . KNEE ARTHROPLASTY Right 2006    Allergies Penicillins  Social History Social History   Tobacco Use  . Smoking status: Never Smoker  . Smokeless tobacco: Never Used  Substance Use Topics  . Alcohol use: Yes    Comment: Rarely  . Drug use: Never   Review of Systems Constitutional: Negative for fever. Cardiovascular: Positive for chest pain Respiratory: Negative for shortness of breath. Gastrointestinal: Negative for abdominal pain, vomiting and diarrhea. Musculoskeletal: Negative for back pain. Skin: Negative for rash. Neurological: Negative for headaches, focal weakness or numbness.  All systems negative/normal/unremarkable except as stated in the HPI  ____________________________________________   PHYSICAL EXAM:  VITAL SIGNS: ED Triage Vitals  Enc Vitals Group     BP 09/10/18 0939 (!) 130/96     Pulse Rate 09/10/18 0938 79     Resp 09/10/18 0938 18     Temp 09/10/18 0938 97.8 F (36.6 C)     Temp Source 09/10/18 0938 Oral     SpO2 09/10/18 0938 98 %     Weight 09/10/18 0937 195 lb (88.5 kg)     Height 09/10/18 0937 5\' 6"  (1.676 m)     Head  Circumference --      Peak Flow --      Pain Score 09/10/18 0937 4     Pain Loc --      Pain Edu? --      Excl. in GC? --    Constitutional: Alert and oriented. Well appearing and in no distress. Eyes: Conjunctivae are normal. Normal extraocular movements. ENT      Head: Normocephalic and atraumatic.      Nose: No congestion/rhinnorhea.      Mouth/Throat: Mucous membranes are moist.      Neck: No stridor. Cardiovascular: Normal rate, regular rhythm. No murmurs, rubs, or gallops. Respiratory: Normal respiratory effort without tachypnea nor retractions. Breath sounds are clear and equal bilaterally. No wheezes/rales/rhonchi. Gastrointestinal: Soft and nontender. Normal bowel sounds Musculoskeletal: Nontender with normal range of motion in extremities. No lower extremity tenderness nor edema. Neurologic:  Normal speech and language. No gross focal neurologic deficits are appreciated.  Skin:  Skin is warm, dry and intact. No rash noted. Psychiatric: Mood and affect are normal. Speech and behavior are normal.  ____________________________________________  EKG: Interpreted by me.  Sinus rhythm the rate of 74 bpm, normal PR interval, normal QRS, normal QT  ____________________________________________  ED COURSE:  As part of my medical decision making, I reviewed the following data within the electronic MEDICAL RECORD NUMBER History obtained from family if available, nursing notes, old chart and ekg, as well as notes from prior ED visits. Patient presented for chest pain,  we will assess with labs and imaging as indicated at this time.   Procedures  Garrett Morse was evaluated in Emergency Department on 09/10/2018 for the symptoms described in the history of present illness. He was evaluated in the context of the global COVID-19 pandemic, which necessitated consideration that the patient might be at risk for infection with the SARS-CoV-2 virus that causes COVID-19. Institutional protocols and  algorithms that pertain to the evaluation of patients at risk for COVID-19 are in a state of rapid change based on information released by regulatory bodies including the CDC and federal and state organizations. These policies and algorithms were followed during the patient's care in the ED.  ____________________________________________   LABS (pertinent positives/negatives)  Labs Reviewed  BASIC METABOLIC PANEL - Abnormal; Notable for the following components:      Result Value   Glucose, Bld 147 (*)    All other components within normal limits  CBC  TROPONIN I    RADIOLOGY Images were viewed by me  Chest x-ray IMPRESSION: No edema or consolidation.  Apparent small hiatal hernia. ____________________________________________   DIFFERENTIAL DIAGNOSIS   Musculoskeletal pain, GERD, anxiety, unstable angina  FINAL ASSESSMENT AND PLAN  Chest pain   Plan: The patient had presented for nonspecific chest pain. Patient's labs not reveal any acute process. Patient's imaging was reassuring.  His heart score is low risk for ACS.  He is cleared for outpatient follow-up.   Ulice DashJohnathan E Williams, MD    Note: This note was generated in part or whole with voice recognition software. Voice recognition is usually quite accurate but there are transcription errors that can and very often do occur. I apologize for any typographical errors that were not detected and corrected.     Emily FilbertWilliams, Jonathan E, MD 09/10/18 1120

## 2018-09-10 NOTE — ED Triage Notes (Signed)
Pt arrives with complaints of left sided chest pain that started last night. Pt reports the pain as a tightness/cramping sensation. Pt reports the pain is intermittent.

## 2018-09-13 ENCOUNTER — Ambulatory Visit: Payer: Self-pay

## 2018-09-13 DIAGNOSIS — E1165 Type 2 diabetes mellitus with hyperglycemia: Secondary | ICD-10-CM

## 2018-09-13 DIAGNOSIS — E785 Hyperlipidemia, unspecified: Secondary | ICD-10-CM

## 2018-09-13 NOTE — Chronic Care Management (AMB) (Signed)
  Care Management   Note  09/13/2018 Name: Garrett Morse MRN: [id number] DOB: 09/22/1985   Garrett Morse a 33year old malewho seesAdriana Pollak PA-Cfor primary care. Garrett Morse the CCM team to consult the patient forcare management related to DM education needs. Patient has a history of but not limited toDM. Referral was placed4/17/2020 during office visit.Patient consented to services on 08/30/2018 but unable to schedule initial telephone assessment.  Today, CCM RN CM was able to reach Garrett Morse via telephone and initial assessment and goal setting appointment was scheduled.    Plan: Appointment scheduled for 09/20/2018 at 1:00  Garrett Keleher E. Suzie Portela, RN, BSN Nurse Care Coordinator [facility] Care Management [phone]

## 2018-09-20 ENCOUNTER — Ambulatory Visit: Payer: Self-pay

## 2018-09-20 DIAGNOSIS — E785 Hyperlipidemia, unspecified: Secondary | ICD-10-CM

## 2018-09-20 DIAGNOSIS — E1165 Type 2 diabetes mellitus with hyperglycemia: Secondary | ICD-10-CM

## 2018-09-20 NOTE — Chronic Care Management (AMB) (Signed)
  Care Management   Note  09/13/2018 Name: Anis Kendall         MRN: [id number]       DOB: 1985-07-19   Garrett Morse a 33year old malewho seesAdriana Pollak PA-Cfor primary care. Garrett Morse the CCM team to consult the patient forcare management related to DM education needs. Patient has a history of but not limited toDM. Referral was placed4/17/2020 during office visit.Patient consented to services on 08/30/2018 and initial assessment was scheduled for 09/13/2018 however patient was unavailable due to time constraints.  Appointment was rescheduled for today 09/20/2018 at 1:00. Patient again unavailable for telephone assessment as he is currently just leaving work. He wishes to reschedule. CCM RN CM discussed importance of keeping appointment however understands the overwhelming obligations of essential workers at this time.  Today, CCM RN CM was able to reach Garrett Morse via telephone and initial assessment and goal setting appointment was scheduled.    Plan:Appointment rescheduled for third time for 10/04/2018 at 2:30  Jamarrius Salay E. Suzie Portela, RN, BSN Nurse Care Coordinator [facility] Care Management [phone]

## 2018-09-26 ENCOUNTER — Other Ambulatory Visit: Payer: Self-pay | Admitting: Physician Assistant

## 2018-09-26 DIAGNOSIS — E1165 Type 2 diabetes mellitus with hyperglycemia: Secondary | ICD-10-CM

## 2018-09-27 MED ORDER — SEMAGLUTIDE (1 MG/DOSE) 2 MG/1.5ML ~~LOC~~ SOPN: 1.0000 mg | PEN_INJECTOR | SUBCUTANEOUS | 0 refills | Status: DC

## 2018-10-04 ENCOUNTER — Ambulatory Visit: Payer: Self-pay

## 2018-10-04 ENCOUNTER — Other Ambulatory Visit: Payer: Self-pay

## 2018-10-04 DIAGNOSIS — E1165 Type 2 diabetes mellitus with hyperglycemia: Secondary | ICD-10-CM

## 2018-10-04 NOTE — Chronic Care Management (AMB) (Signed)
  Care Management   Initial Visit Note  10/04/2018 Name: Garrett Morse MRN: [id number] DOB: [birth year]   Subjective: "I know that my diet is really keeping my sugars high"  Objective:  Lab Results  Component Value Date   HGBA1C 8.4 (A) 08/30/2018   HGBA1C 8.5 (A) 05/31/2018   HGBA1C 8.0 (A) 02/22/2018   Lab Results  Component Value Date   LDLCALC 118 (H) 02/22/2018   CREATININE 0.97 09/10/2018    Assessment: Garrett Thorntonis a 33year old malewho seesAdriana Pollak PA-Cfor primary care. Garrett Morse the CCM team to consult the patient forcare management related to DM education needs. Patient has a history of but not limited toDM. Referral was placed4/17/2020 during office visit. Today CCM RN CM completed initial assessment, provided verbal education related to DM management, and assisted patient with establishing health goals.  Review of patient status, including review of consultants reports, relevant laboratory and other test results, and collaboration with appropriate care team members and the patient's provider was performed as part of comprehensive patient evaluation and provision of chronic care management services.     <no information>  Goals Addressed            This Visit's Progress   . I think my diet is the biggest thing I need to work on  (pt-stated)       Current Barriers:  Garrett Morse Kitchen Knowledge Deficits related to basic Diabetes pathophysiology and self care/management  Nurse Case Manager Clinical Goal(s):  Garrett Morse Kitchen Over the next 14 days, patient will demonstrate improved adherence to prescribed treatment plan for diabetes self care/management as evidenced by checking blood glucose levels as prescribed, adhering to ADA/low carb diet, daily exercise, and medication adherence  Interventions:  . Provided education to patient re: diabetes, specifically on carb counting diet . Reviewed medications with patient and discussed importance of medication adherence .  Discussed plans with patient for ongoing care management follow up and provided patient with direct contact information for care management team . Provided patient with written educational materials related to hypo and hyperglycemia and importance of correct treatment . Reviewed scheduled/upcoming provider appointments including:     Patient Self Care Activities:  . Self administers medications as prescribed . Attends all scheduled provider appointments . Checks blood sugars as prescribed and utilize hyper and hypoglycemia protocol as needed . Adhere to ADA diet as discussed . Exercise daily  Plan:  . Patient will pick up Farciga from pharmacy and take as prescribed . RNCM will provide discussed information/educational materials via email   Initial goal documentation          Follow up plan:  Telephone follow up appointment with CCM team member scheduled for: 2 weeks  Garrett Morse was given information about Care Management services today including:  1. Case Management services include personalized support from designated clinical staff supervised by a physician, including individualized plan of care and coordination with other care providers 2. 24/7 contact phone numbers for assistance for urgent and routine care needs. 3. The patient may stop CCM services at any time (effective at the end of the month) by phone call to the office staff.  Patient agreed to services and verbal consent obtained.  Garrett Morse E. Suzie Portela, RN, BSN Nurse Care Coordinator [facility] Care Management [phone]

## 2018-10-04 NOTE — Patient Instructions (Addendum)
Thank you allowing the Chronic Care Management Team to be a part of your care! It was a pleasure speaking with you today!  1. Please pick up your Farxiga ASAP and take as directed.  2. Increase your daily water intake. You urine needs to be pale yellow. 3. Read food labels. Please look at the total carbohydrate count (as it relates to portions) and not just sugar content. YOU CAN HAVE 45-60 GRAMS OF CARBOHYDRATES AT [facility] MEAL AND 15-30 GRAMS FOR AN EVENING SNACK 4. Please make sure you are having protein at each meal. This will help stabilize your blood sugars 5. You may find it helpful to do a food diary to see which foods affect your blood sugars more 6. Check your blood sugar every morning before you eat. I would also check 2 hours after your evening meals. Please right these numbers down so we can discuss in two week. 7. I am going to also send you educational materials from [place] (Chronic Care Management) Team   Trish Fountain RN, BSN Nurse Care Coordinator  [phone]  Ruben Reason PharmD  Clinical Pharmacist  [phone]   Elliot Gurney, LCSW Clinical Social Worker [phone]  Goals Addressed            This Visit's Progress   . I think my diet is the biggest thing I need to work on  (pt-stated)       Current Barriers:  Marland Kitchen Knowledge Deficits related to basic Diabetes pathophysiology and self care/management  Nurse Case Manager Clinical Goal(s):  Marland Kitchen Over the next 14 days, patient will demonstrate improved adherence to prescribed treatment plan for diabetes self care/management as evidenced by checking blood glucose levels as prescribed, adhering to ADA/low carb diet, daily exercise, and medication adherence  Interventions:  . Provided education to patient re: diabetes, specifically on carb counting diet . Reviewed medications with patient and discussed importance of medication adherence . Discussed plans with patient for ongoing care management follow up  and provided patient with direct contact information for care management team . Provided patient with written educational materials related to hypo and hyperglycemia and importance of correct treatment . Reviewed scheduled/upcoming provider appointments including:     Patient Self Care Activities:  . Self administers medications as prescribed . Attends all scheduled provider appointments . Checks blood sugars as prescribed and utilize hyper and hypoglycemia protocol as needed . Adhere to ADA diet as discussed . Exercise daily  Plan:  . Patient will pick up Farciga from pharmacy and take as prescribed . RNCM will provide discussed information/educational materials via email   Initial goal documentation         Print copy of patient instructions provided.  via email  Telephone follow up appointment with CCM team member scheduled for: 2 weeks  SYMPTOMS OF A STROKE   You have any symptoms of stroke. "BE FAST" is an easy way to remember the main warning signs: ? B - Balance. Signs are dizziness, sudden trouble walking, or loss of balance. ? E - Eyes. Signs are trouble seeing or a sudden change in how you see. ? F - Face. Signs are sudden weakness or loss of feeling of the face, or the face or eyelid drooping on one side. ? A - Arms. Signs are weakness or loss of feeling in an arm. This happens suddenly and usually on one side of the body. ? S - Speech. Signs are sudden trouble speaking, slurred speech, or  trouble understanding what people say. ? T - Time. Time to call emergency services. Write down what time symptoms started.  You have other signs of stroke, such as: ? A sudden, very bad headache with no known cause. ? Feeling sick to your stomach (nausea). ? Throwing up (vomiting). ? Jerky movements you cannot control (seizure).  SYMPTOMS OF A HEART ATTACK  What are the signs or symptoms? Symptoms of this condition include:  Chest pain. It may feel like: ? Crushing or  squeezing. ? Tightness, pressure, fullness, or heaviness.  Pain in the arm, neck, jaw, back, or upper body.  Shortness of breath.  Heartburn.  Indigestion.  Nausea.  Cold sweats.  Feeling tired.       Type 2 Diabetes Mellitus, Self Care, Adult When you have type 2 diabetes (type 2 diabetes mellitus), you must make sure your blood sugar (glucose) stays in a healthy range. You can do this with:  Nutrition.  Exercise.  Lifestyle changes.  Medicines or insulin, if needed.  Support from your doctors and others. How to stay aware of blood sugar   Check your blood sugar level every day, as often as told.  Have your A1c (hemoglobin A1c) level checked two or more times a year. Have it checked more often if your doctor tells you to. Your doctor will set personal treatment goals for you. Generally, you should have these blood sugar levels:  Before meals (preprandial): 80-130 mg/dL (4.4-7.2 mmol/L).  After meals (postprandial): below 180 mg/dL (10 mmol/L). THIS IS 2 HOURS AFTER MEAL  A1c level: less than 7%. YOURS WAS 8.5 WHICH MEANS YOUR AVERAGE BLOOD SUGAR IS 225  How to manage high and low blood sugar Signs of high blood sugar High blood sugar is called hyperglycemia. Know the signs of high blood sugar. Signs may include:  Feeling: ? Thirsty. ? Hungry. ? Very tired.  Needing to pee (urinate) more than usual.  Blurry vision. Signs of low blood sugar Low blood sugar is called hypoglycemia. This is when blood sugar is at or below 70 mg/dL (3.9 mmol/L). Signs may include:  Feeling: ? Hungry. ? Worried or nervous (anxious). ? Sweaty and clammy. ? Confused. ? Dizzy. ? Sleepy. ? Sick to your stomach (nauseous).  Having: ? A fast heartbeat. ? A headache. ? A change in your vision. ? Jerky movements that you cannot control (seizure). ? Tingling or no feeling (numbness) around your mouth, lips, or tongue.  Having trouble with: ? Moving  (coordination). ? Sleeping. ? Passing out (fainting). ? Getting upset easily (irritability). Treating low blood sugar TREAT <70 BELOW, TREAT<90 BUT >70 WITH FOOD To treat low blood sugar, eat or drink something sugary right away. If you can think clearly and swallow safely, follow the 15:15 rule:  Take 15 grams of a fast-acting carb (carbohydrate). Talk with your doctor about how much you should take.  Some fast-acting carbs are: ? Sugar tablets (glucose pills). Take 3-4 pills. ? 6-8 pieces of hard candy. ? 4-6 oz (120-150 mL) of fruit juice. ? 4-6 oz (120-150 mL) of regular (not diet) soda. ? 1 Tbsp (15 mL) honey or sugar.  Check your blood sugar 15 minutes after you take the carb.  If your blood sugar is still at or below 70 mg/dL (3.9 mmol/L), take 15 grams of a carb again.  If your blood sugar does not go above 70 mg/dL (3.9 mmol/L) after 3 tries, get help right away.  After your blood sugar goes back to  normal, eat a meal or a snack within 1 hour. Treating very low blood sugar If your blood sugar is at or below 54 mg/dL (3 mmol/L), you have very low blood sugar (severe hypoglycemia). This is an emergency. Do not wait to see if the symptoms will go away. Get medical help right away. Call your local emergency services (911 in the [place] If you have very low blood sugar and you cannot eat or drink, you may need a glucagon shot (injection). A family member or friend should learn how to check your blood sugar and how to give you a glucagon shot. Ask your doctor if you need to have a glucagon shot kit at home. Follow these instructions at home: Medicine  Take insulin and diabetes medicines as told.  If your doctor says you should take more or less insulin and medicines, do this exactly as told.  Do not run out of insulin or medicines. Having diabetes can raise your risk for other long-term conditions. These include heart disease and kidney disease. Your doctor may prescribe medicines  to help you not have these problems. Food   Make healthy food choices. These include: ? Chicken, fish, egg whites, and beans. ? Oats, whole wheat, bulgur, brown rice, quinoa, and millet. ? Fresh fruits and vegetables. ? Low-fat dairy products. ? Nuts, avocado, olive oil, and canola oil.  Meet with a food specialist (dietitian). He or she can help you make an eating plan that is right for you.  Follow instructions from your doctor about what you cannot eat or drink.  Drink enough fluid to keep your pee (urine) pale yellow.  Keep track of carbs that you eat. Do this by reading food labels and learning food serving sizes.  Follow your sick day plan when you cannot eat or drink normally. Make this plan with your doctor so it is ready to use. Activity  Exercise 3 or more times a week.  Do not go more than 2 days without exercising.  Talk with your doctor before you start a new exercise. Your doctor may need to tell you to change: ? How much insulin or medicines you take. ? How much food you eat. Lifestyle  Do not use any tobacco products. These include cigarettes, chewing tobacco, and e-cigarettes. If you need help quitting, ask your doctor.  Ask your doctor how much alcohol is safe for you.  Learn to deal with stress. If you need help with this, ask your doctor. Body care  Stay up to date with your shots (immunizations).  Have your eyes and feet checked by a doctor as often as told.  Check your skin and feet every day. Check for cuts, bruises, redness, blisters, or sores.  Brush your teeth and gums two times a day. Floss one or more times a day.  Go to the dentist one or more times every 6 months.  Stay at a healthy weight. General instructions  Take over-the-counter and prescription medicines only as told by your doctor.  Share your diabetes care plan with: ? Your work or school. ? People you live with.  Carry a card or wear jewelry that says you have  diabetes.  Keep all follow-up visits as told by your doctor. This is important. Questions to ask your doctor  Do I need to meet with a diabetes educator?  Where can I find a support group for people with diabetes? Where to find more information To learn more about diabetes, visit:  American Diabetes  Association: www.diabetes.org  American Association of Diabetes Educators: www.diabeteseducator.org Summary  When you have type 2 diabetes, you must make sure your blood sugar (glucose) stays in a healthy range.  Check your blood sugar every day, as often as told.  Having diabetes can raise your risk for other conditions. Your doctor may prescribe medicines to help you not have these problems.  Keep all follow-up visits as told by your doctor. This is important. This information is not intended to replace advice given to you by your health care provider. Make sure you discuss any questions you have with your health care provider. Document Released: 08/23/2015 Document Revised: 10/22/2017 Document Reviewed: 06/04/2015 Elsevier Interactive Patient Education  2019 [employer].   Diabetes Mellitus and Nutrition, Adult When you have diabetes (diabetes mellitus), it is very important to have healthy eating habits because your blood sugar (glucose) levels are greatly affected by what you eat and drink. Eating healthy foods in the appropriate amounts, at about the same times every day, can help you:  Control your blood glucose.  Lower your risk of heart disease.  Improve your blood pressure.  Reach or maintain a healthy weight. Every person with diabetes is different, and each person has different needs for a meal plan. Your health care provider may recommend that you work with a diet and nutrition specialist (dietitian) to make a meal plan that is best for you. Your meal plan may vary depending on factors such as:  The calories you need.  The medicines you take.  Your  weight.  Your blood glucose, blood pressure, and cholesterol levels.  Your activity level.  Other health conditions you have, such as heart or kidney disease. How do carbohydrates affect me? Carbohydrates, also called carbs, affect your blood glucose level more than any other type of food. Eating carbs naturally raises the amount of glucose in your blood. Carb counting is a method for keeping track of how many carbs you eat. Counting carbs is important to keep your blood glucose at a healthy level, especially if you use insulin or take certain oral diabetes medicines. It is important to know how many carbs you can safely have in each meal. This is different for every person. Your dietitian can help you calculate how many carbs you should have at each meal and for each snack. Foods that contain carbs include:  Bread, cereal, rice, pasta, and crackers.  Potatoes and corn.  Peas, beans, and lentils.  Milk and yogurt.  Fruit and juice.  Desserts, such as cakes, cookies, ice cream, and candy. How does alcohol affect me? Alcohol can cause a sudden decrease in blood glucose (hypoglycemia), especially if you use insulin or take certain oral diabetes medicines. Hypoglycemia can be a life-threatening condition. Symptoms of hypoglycemia (sleepiness, dizziness, and confusion) are similar to symptoms of having too much alcohol. If your health care provider says that alcohol is safe for you, follow these guidelines:  Limit alcohol intake to no more than 1 drink per day for nonpregnant women and 2 drinks per day for men. One drink equals 12 oz of beer, 5 oz of wine, or 1 oz of hard liquor.  Do not drink on an empty stomach.  Keep yourself hydrated with water, diet soda, or unsweetened iced tea.  Keep in mind that regular soda, juice, and other mixers may contain a lot of sugar and must be counted as carbs. What are tips for following this plan?  Reading food labels  Start by checking  the  serving size on the "Nutrition Facts" label of packaged foods and drinks. The amount of calories, carbs, fats, and other nutrients listed on the label is based on one serving of the item. Many items contain more than one serving per package.  Check the total grams (g) of carbs in one serving. You can calculate the number of servings of carbs in one serving by dividing the total carbs by 15. For example, if a food has 30 g of total carbs, it would be equal to 2 servings of carbs.  Check the number of grams (g) of saturated and trans fats in one serving. Choose foods that have low or no amount of these fats.  Check the number of milligrams (mg) of salt (sodium) in one serving. Most people should limit total sodium intake to less than 2,300 mg per day.  Always check the nutrition information of foods labeled as "low-fat" or "nonfat". These foods may be higher in added sugar or refined carbs and should be avoided.  Talk to your dietitian to identify your daily goals for nutrients listed on the label. Shopping  Avoid buying canned, premade, or processed foods. These foods tend to be high in fat, sodium, and added sugar.  Shop around the outside edge of the grocery store. This includes fresh fruits and vegetables, bulk grains, fresh meats, and fresh dairy. Cooking  Use low-heat cooking methods, such as baking, instead of high-heat cooking methods like deep frying.  Cook using healthy oils, such as olive, canola, or sunflower oil.  Avoid cooking with butter, cream, or high-fat meats. Meal planning  Eat meals and snacks regularly, preferably at the same times every day. Avoid going long periods of time without eating.  Eat foods high in fiber, such as fresh fruits, vegetables, beans, and whole grains. Talk to your dietitian about how many servings of carbs you can eat at each meal.  Eat 4-6 ounces (oz) of lean protein each day, such as lean meat, chicken, fish, eggs, or tofu. One oz of lean  protein is equal to: ? 1 oz of meat, chicken, or fish. ? 1 egg. ?  cup of tofu.  Eat some foods each day that contain healthy fats, such as avocado, nuts, seeds, and fish. Lifestyle  Check your blood glucose regularly.  Exercise regularly as told by your health care provider. This may include: ? 150 minutes of moderate-intensity or vigorous-intensity exercise each week. This could be brisk walking, biking, or water aerobics. ? Stretching and doing strength exercises, such as yoga or weightlifting, at least 2 times a week.  Take medicines as told by your health care provider.  Do not use any products that contain nicotine or tobacco, such as cigarettes and e-cigarettes. If you need help quitting, ask your health care provider.  Work with a Social worker or diabetes educator to identify strategies to manage stress and any emotional and social challenges. Questions to ask a health care provider  Do I need to meet with a diabetes educator?  Do I need to meet with a dietitian?  What number can I call if I have questions?  When are the best times to check my blood glucose? Where to find more information:  American Diabetes Association: diabetes.org  Academy of Nutrition and Dietetics: www.eatright.[employer] of Diabetes and Digestive and Kidney Diseases (NIH): [employer] Summary  A healthy meal plan will help you control your blood glucose and maintain a healthy lifestyle.  Working with a diet and  nutrition specialist (dietitian) can help you make a meal plan that is best for you.  Keep in mind that carbohydrates (carbs) and alcohol have immediate effects on your blood glucose levels. It is important to count carbs and to use alcohol carefully. This information is not intended to replace advice given to you by your health care provider. Make sure you discuss any questions you have with your health care provider. Document Released: 01/26/2005 Document Revised:  11/29/2016 Document Reviewed: 06/05/2016 Elsevier Interactive Patient Education  2019 [employer].

## 2018-10-17 ENCOUNTER — Ambulatory Visit: Payer: Self-pay

## 2018-10-17 ENCOUNTER — Other Ambulatory Visit: Payer: Self-pay

## 2018-10-17 DIAGNOSIS — E1165 Type 2 diabetes mellitus with hyperglycemia: Secondary | ICD-10-CM

## 2018-10-17 DIAGNOSIS — E785 Hyperlipidemia, unspecified: Secondary | ICD-10-CM

## 2018-10-17 NOTE — Chronic Care Management (AMB) (Signed)
  Care Management   Follow Up Note   10/17/2018 Name: Garrett Morse MRN: [id number] DOB: July 12, 1985  Referred by: Garrett Sailors, PA-C Reason for referral : Chronic Care Management (follow up on DM)    Subjective: "I am trying to do all the things I am supposed to do to improve my diabetes"   Objective: Lab Results  Component Value Date   HGBA1C 8.4 (A) 08/30/2018   HGBA1C 8.5 (A) 05/31/2018   HGBA1C 8.0 (A) 02/22/2018   Lab Results  Component Value Date   LDLCALC 118 (H) 02/22/2018   CREATININE 0.97 09/10/2018    Assessment: Garrett Thorntonis a 33year old malewho seesAdriana Pollak PA-Cfor primary care. Ms. Garrett Morse the CCM team to consult the patient forcare management related to DM education needs. Patient has a history of but not limited toDM. Referral was placed4/17/2020 during office visit. He was provided with DM education and today CCM RN CM followed up with [name] via telephone to assess for progression towards health goals.  Review of patient status, including review of consultants reports, relevant laboratory and other test results, and collaboration with appropriate care team members and the patient's provider was performed as part of comprehensive patient evaluation and provision of chronic care management services.    Goals Addressed            This Visit's Progress   . COMPLETED: I think my diet is the biggest thing I need to work on  (pt-stated)       Current Barriers:  Marland Kitchen Knowledge Deficits related to basic Diabetes pathophysiology and self care/management  Nurse Case Manager Clinical Goal(s):  Marland Kitchen Over the next 14 days, patient will demonstrate improved adherence to prescribed treatment plan for diabetes self care/management as evidenced by checking blood glucose levels as prescribed, adhering to ADA/low carb diet, daily exercise, and medication adherence  Interventions:  . Provided education to patient re: diabetes, specifically on  carb counting diet . Reviewed medications with patient and discussed importance of medication adherence . Discussed plans with patient for ongoing care management follow up and provided patient with direct contact information for care management team . Provided patient with written educational materials related to hypo and hyperglycemia and importance of correct treatment . Reviewed scheduled/upcoming provider appointments including:     Patient Self Care Activities:  . Self administers medications as prescribed . Attends all scheduled provider appointments . Checks blood sugars as prescribed and utilize hyper and hypoglycemia protocol as needed . Adhere to ADA diet as discussed . Exercise daily  Plan:  . Patient will pick up Farciga from pharmacy and take as prescribed . RNCM will provide discussed information/educational materials via email   Initial goal documentation          Follow Up Plan: Patient has been provided contact information for CCM RN CM and encouraged to call if any additional needs arise. He agrees that he has all received adequate information to manage his DM and feels no additional follow up is needed at this time.   Garrett Morse E. Suzie Portela, RN, BSN Nurse Care Coordinator [facility] [phone]

## 2018-10-17 NOTE — Patient Instructions (Signed)
Thank you allowing the Chronic Care Management Team to be a part of your care! It was a pleasure speaking with you today!  1. Please continue to take your medications as prescribed.  2. Please go to [facility] and pick up your strips so you can check your sugars daily 3. Exercise daily. You do not need a gym to exercise. Walking is the best exercise you can do to manage your diabetes. 4. I am happy you have started reading food labels and managing your carbohydrates. Keep up the good work!  CCM (Chronic Care Management) Team   Yvone NeuPortia Keivon Garden RN, BSN Nurse Care Coordinator  [phone]  Karalee HeightJulie Hedrick PharmD  Clinical Pharmacist  [phone]   Verna Czechhrystal Land, LCSW Clinical Social Worker [phone]  Goals Addressed            This Visit's Progress   . COMPLETED: I think my diet is the biggest thing I need to work on  (pt-stated)       Current Barriers:  Marland Kitchen. Knowledge Deficits related to basic Diabetes pathophysiology and self care/management  Nurse Case Manager Clinical Goal(s):  Marland Kitchen. Over the next 14 days, patient will demonstrate improved adherence to prescribed treatment plan for diabetes self care/management as evidenced by checking blood glucose levels as prescribed, adhering to ADA/low carb diet, daily exercise, and medication adherence  Interventions:  . Provided education to patient re: diabetes, specifically on carb counting diet . Reviewed medications with patient and discussed importance of medication adherence . Discussed plans with patient for ongoing care management follow up and provided patient with direct contact information for care management team . Provided patient with written educational materials related to hypo and hyperglycemia and importance of correct treatment . Reviewed scheduled/upcoming provider appointments including:     Patient Self Care Activities:  . Self administers medications as prescribed . Attends all scheduled provider  appointments . Checks blood sugars as prescribed and utilize hyper and hypoglycemia protocol as needed . Adhere to ADA diet as discussed . Exercise daily  Plan:  . Patient will pick up Farciga from pharmacy and take as prescribed . RNCM will provide discussed information/educational materials via email   Initial goal documentation         The patient verbalized understanding of instructions provided today and declined a print copy of patient instruction materials.   The patient has been provided with contact information for the care management team and has been advised to call with any health related questions or concerns.   SYMPTOMS OF A STROKE   You have any symptoms of stroke. "BE FAST" is an easy way to remember the main warning signs: ? B - Balance. Signs are dizziness, sudden trouble walking, or loss of balance. ? E - Eyes. Signs are trouble seeing or a sudden change in how you see. ? F - Face. Signs are sudden weakness or loss of feeling of the face, or the face or eyelid drooping on one side. ? A - Arms. Signs are weakness or loss of feeling in an arm. This happens suddenly and usually on one side of the body. ? S - Speech. Signs are sudden trouble speaking, slurred speech, or trouble understanding what people say. ? T - Time. Time to call emergency services. Write down what time symptoms started.  You have other signs of stroke, such as: ? A sudden, very bad headache with no known cause. ? Feeling sick to your stomach (nausea). ? Throwing up (vomiting). ? Jerky  movements you cannot control (seizure).  SYMPTOMS OF A HEART ATTACK  What are the signs or symptoms? Symptoms of this condition include:  Chest pain. It may feel like: ? Crushing or squeezing. ? Tightness, pressure, fullness, or heaviness.  Pain in the arm, neck, jaw, back, or upper body.  Shortness of breath.  Heartburn.  Indigestion.  Nausea.  Cold sweats.  Feeling tired.  Sudden  lightheadedness.

## 2018-10-25 ENCOUNTER — Other Ambulatory Visit: Payer: Self-pay | Admitting: Physician Assistant

## 2018-10-25 DIAGNOSIS — E1165 Type 2 diabetes mellitus with hyperglycemia: Secondary | ICD-10-CM

## 2018-10-28 MED ORDER — OZEMPIC (1 MG/DOSE) 2 MG/1.5ML ~~LOC~~ SOPN: 1.0000 mg | PEN_INJECTOR | SUBCUTANEOUS | 0 refills | Status: DC

## 2018-11-29 ENCOUNTER — Other Ambulatory Visit: Payer: Self-pay

## 2018-11-29 ENCOUNTER — Ambulatory Visit (INDEPENDENT_AMBULATORY_CARE_PROVIDER_SITE_OTHER): Payer: No Typology Code available for payment source | Admitting: Physician Assistant

## 2018-11-29 ENCOUNTER — Encounter: Payer: Self-pay | Admitting: Physician Assistant

## 2018-11-29 VITALS — BP 122/76 | HR 81 | Temp 98.3°F | Resp 16 | Wt 203.0 lb

## 2018-11-29 DIAGNOSIS — E1165 Type 2 diabetes mellitus with hyperglycemia: Secondary | ICD-10-CM

## 2018-11-29 LAB — POCT GLYCOSYLATED HEMOGLOBIN (HGB A1C): Hemoglobin A1C: 7.2 % — AB (ref 4.0–5.6)

## 2018-11-29 MED ORDER — FARXIGA 10 MG PO TABS: 10.0000 mg | ORAL_TABLET | Freq: Every day | ORAL | 1 refills | Status: DC

## 2018-11-29 NOTE — Progress Notes (Signed)
Patient: Garrett Morse Male    DOB: 1986/03/09   33 y.o.   MRN: [id number] Visit Date: 12/03/2018  Today's Provider: Trinna Post, PA-C   Chief Complaint  Patient presents with  . Diabetes   Subjective:     HPI   3 Month diabetic follow up. Needs to update eye exam. He is going to schedule this. A1c decreased from 8.4% to 7.2%. Currently taking ozempic 1 mg subQ weekly and farxiga 10 mg daily.   Lab Results  Component Value Date   HGBA1C 7.2 (A) 11/29/2018     Allergies  Allergen Reactions  . Penicillins Hives     Current Outpatient Medications:  .  fexofenadine (ALLEGRA ALLERGY) 180 MG tablet, Take 180 mg by mouth daily., Disp: , Rfl:  .  fluticasone (FLONASE) 50 MCG/ACT nasal spray, Place 2 sprays into both nostrils daily., Disp: 16 g, Rfl: 6 .  Semaglutide, 1 MG/DOSE, (OZEMPIC, 1 MG/DOSE,) 2 MG/1.5ML SOPN, Inject 1 mg into the skin once a week., Disp: 9 mL, Rfl: 0 .  dapagliflozin propanediol (FARXIGA) 10 MG TABS tablet, Take 10 mg by mouth daily., Disp: 900 mg, Rfl: 1 .  levocetirizine (XYZAL) 5 MG tablet, Take 1 tablet (5 mg total) by mouth every evening. (Patient not taking: Reported on 10/04/2018), Disp: 90 tablet, Rfl: 0  Review of Systems  All other systems reviewed and are negative.   Social History   Tobacco Use  . Smoking status: Never Smoker  . Smokeless tobacco: Never Used  Substance Use Topics  . Alcohol use: Yes    Comment: Rarely      Objective:   BP 122/76 (BP Location: Left Arm, Patient Position: Sitting, Cuff Size: Normal)   Pulse 81   Temp 98.3 F (36.8 C) (Oral)   Resp 16   Wt 203 lb (92.1 kg)   SpO2 98%   BMI 32.77 kg/m  Vitals:   11/29/18 1017  BP: 122/76  Pulse: 81  Resp: 16  Temp: 98.3 F (36.8 C)  TempSrc: Oral  SpO2: 98%  Weight: 203 lb (92.1 kg)     Physical Exam Constitutional:      Appearance: Normal appearance.  Cardiovascular:     Rate and Rhythm: Normal rate and regular rhythm.     Heart  sounds: Normal heart sounds.  Pulmonary:     Effort: Pulmonary effort is normal.     Breath sounds: Normal breath sounds.  Skin:    General: Skin is warm and dry.  Neurological:     Mental Status: He is alert and oriented to person, place, and time. Mental status is at baseline.  Psychiatric:        Mood and Affect: Mood normal.        Behavior: Behavior normal.      Results for orders placed or performed in visit on 11/29/18  POCT HgB A1C  Result Value Ref Range   Hemoglobin A1C 7.2 (A) 4.0 - 5.6 %   HbA1c POC (<> result, manual entry)     HbA1c, POC (prediabetic range)     HbA1c, POC (controlled diabetic range)         Assessment & Plan    1. Type 2 diabetes mellitus with hyperglycemia, unspecified whether long term insulin use (HCC)  Keep medications the same, follow up in 3 months for diabetes.  - POCT HgB A1C - dapagliflozin propanediol (FARXIGA) 10 MG TABS tablet; Take 10 mg by mouth daily.  Dispense:  900 mg; Refill: 1 - POCT UA - Microalbumin  The entirety of the information documented in the History of Present Illness, Review of Systems and Physical Exam were personally obtained by me. Portions of this information were initially documented by Lexine BatonStacy Baldwin, LPN and reviewed by me for thoroughness and accuracy.       Trey SailorsAdriana M Rhianne Soman, PA-C  [facility]

## 2018-12-02 NOTE — Patient Instructions (Signed)
Diabetes Mellitus and Exercise Exercising regularly is important for your overall health, especially when you have diabetes (diabetes mellitus). Exercising is not only about losing weight. It has many other health benefits, such as increasing muscle strength and bone density and reducing body fat and stress. This leads to improved fitness, flexibility, and endurance, all of which result in better overall health. Exercise has additional benefits for people with diabetes, including:  Reducing appetite.  Helping to lower and control blood glucose.  Lowering blood pressure.  Helping to control amounts of fatty substances (lipids) in the blood, such as cholesterol and triglycerides.  Helping the body to respond better to insulin (improving insulin sensitivity).  Reducing how much insulin the body needs.  Decreasing the risk for heart disease by: ? Lowering cholesterol and triglyceride levels. ? Increasing the levels of good cholesterol. ? Lowering blood glucose levels. What is my activity plan? Your health care provider or certified diabetes educator can help you make a plan for the type and frequency of exercise (activity plan) that works for you. Make sure that you:  Do at least 150 minutes of moderate-intensity or vigorous-intensity exercise each week. This could be brisk walking, biking, or water aerobics. ? Do stretching and strength exercises, such as yoga or weightlifting, at least 2 times a week. ? Spread out your activity over at least 3 days of the week.  Get some form of physical activity every day. ? Do not go more than 2 days in a row without some kind of physical activity. ? Avoid being inactive for more than 30 minutes at a time. Take frequent breaks to walk or stretch.  Choose a type of exercise or activity that you enjoy, and set realistic goals.  Start slowly, and gradually increase the intensity of your exercise over time. What do I need to know about managing my  diabetes?   Check your blood glucose before and after exercising. ? If your blood glucose is 240 mg/dL (13.3 mmol/L) or higher before you exercise, check your urine for ketones. If you have ketones in your urine, do not exercise until your blood glucose returns to normal. ? If your blood glucose is 100 mg/dL (5.6 mmol/L) or lower, eat a snack containing 15-20 grams of carbohydrate. Check your blood glucose 15 minutes after the snack to make sure that your level is above 100 mg/dL (5.6 mmol/L) before you start your exercise.  Know the symptoms of low blood glucose (hypoglycemia) and how to treat it. Your risk for hypoglycemia increases during and after exercise. Common symptoms of hypoglycemia can include: ? Hunger. ? Anxiety. ? Sweating and feeling clammy. ? Confusion. ? Dizziness or feeling light-headed. ? Increased heart rate or palpitations. ? Blurry vision. ? Tingling or numbness around the mouth, lips, or tongue. ? Tremors or shakes. ? Irritability.  Keep a rapid-acting carbohydrate snack available before, during, and after exercise to help prevent or treat hypoglycemia.  Avoid injecting insulin into areas of the body that are going to be exercised. For example, avoid injecting insulin into: ? The arms, when playing tennis. ? The legs, when jogging.  Keep records of your exercise habits. Doing this can help you and your health care provider adjust your diabetes management plan as needed. Write down: ? Food that you eat before and after you exercise. ? Blood glucose levels before and after you exercise. ? The type and amount of exercise you have done. ? When your insulin is expected to peak, if you use   insulin. Avoid exercising at times when your insulin is peaking.  When you start a new exercise or activity, work with your health care provider to make sure the activity is safe for you, and to adjust your insulin, medicines, or food intake as needed.  Drink plenty of water while  you exercise to prevent dehydration or heat stroke. Drink enough fluid to keep your urine clear or pale yellow. Summary  Exercising regularly is important for your overall health, especially when you have diabetes (diabetes mellitus).  Exercising has many health benefits, such as increasing muscle strength and bone density and reducing body fat and stress.  Your health care provider or certified diabetes educator can help you make a plan for the type and frequency of exercise (activity plan) that works for you.  When you start a new exercise or activity, work with your health care provider to make sure the activity is safe for you, and to adjust your insulin, medicines, or food intake as needed. This information is not intended to replace advice given to you by your health care provider. Make sure you discuss any questions you have with your health care provider. Document Released: 07/22/2003 Document Revised: 11/23/2016 Document Reviewed: 10/11/2015 Elsevier Patient Education  2020 Elsevier Inc.  

## 2018-12-03 LAB — POCT UA - MICROALBUMIN: Microalbumin Ur, POC: 20 mg/L

## 2018-12-29 ENCOUNTER — Other Ambulatory Visit: Payer: Self-pay | Admitting: Physician Assistant

## 2018-12-29 DIAGNOSIS — E1165 Type 2 diabetes mellitus with hyperglycemia: Secondary | ICD-10-CM

## 2018-12-30 MED ORDER — OZEMPIC (1 MG/DOSE) 2 MG/1.5ML ~~LOC~~ SOPN: 1.0000 mg | PEN_INJECTOR | SUBCUTANEOUS | 0 refills | Status: DC

## 2019-03-04 NOTE — Progress Notes (Signed)
Patient: Garrett Morse Male    DOB: 12/15/1985   33 y.o.   MRN: [id number] Visit Date: 03/05/2019  Today's Provider: [name], PA-C   Chief Complaint  Patient presents with  . Diabetes   Subjective:     HPI  Diabetes Mellitus Type II, Follow-up:   Lab Results  Component Value Date   HGBA1C 7.6 (A) 03/05/2019   HGBA1C 7.2 (A) 11/29/2018   HGBA1C 8.4 (A) 08/30/2018    Last seen for diabetes 3 months ago.  Management since then includes none. He is missing his farxiga about 3x per week. He says it makes him urinate too much. He does take 1 mg ozempic weekly steadily.  He is not having side effects.  Current symptoms include none and have been stable. Home blood sugar records: fasting range: 140-170's  Episodes of hypoglycemia? no   Current insulin regiment: Is not on insulin Most Recent Eye Exam: Not UTD. Last eye exam Garrett Morse 2016 prior to diabetes diagnosis.  Weight trend: stable Prior visit with dietician: No Current exercise: walking Diet: morning he gets a coffee; sandwhich for lunch. Gyro from [employer], sweet tea   Pertinent Labs:    Component Value Date/Time   CHOL 198 02/22/2018 0954   TRIG 96 02/22/2018 0954   HDL 61 02/22/2018 0954   LDLCALC 118 (H) 02/22/2018 0954   CREATININE 0.97 09/10/2018 0949    Wt Readings from Last 3 Encounters:  03/05/19 202 lb (91.6 kg)  11/29/18 203 lb (92.1 kg)  09/10/18 195 lb (88.5 kg)    ------------------------------------------------------------------------  Allergies  Allergen Reactions  . Penicillins Hives     Current Outpatient Medications:  .  dapagliflozin propanediol (FARXIGA) 10 MG TABS tablet, Take 10 mg by mouth daily., Disp: 900 mg, Rfl: 1 .  fexofenadine (ALLEGRA ALLERGY) 180 MG tablet, Take 180 mg by mouth daily., Disp: , Rfl:  .  fluticasone (FLONASE) 50 MCG/ACT nasal spray, Place 2 sprays into both nostrils daily., Disp: 16 g, Rfl: 6 .  Semaglutide, 1 MG/DOSE, (OZEMPIC, 1  MG/DOSE,) 2 MG/1.5ML SOPN, Inject 1 mg into the skin once a week., Disp: 9 mL, Rfl: 0  Review of Systems  Constitutional: Negative.   Eyes: Negative.   Respiratory: Negative.   Cardiovascular: Negative.   Endocrine: Negative.     Social History   Tobacco Use  . Smoking status: Never Smoker  . Smokeless tobacco: Never Used  Substance Use Topics  . Alcohol use: Yes    Comment: Rarely      Objective:   BP 117/84 (BP Location: Right Arm, Patient Position: Sitting, Cuff Size: Large)   Pulse 93   Temp (!) 96.9 F (36.1 C) (Temporal)   Resp 16   Ht 5\' 6"  (1.676 m)   Wt 202 lb (91.6 kg)   BMI 32.60 kg/m  Vitals:   03/05/19 0820  BP: 117/84  Pulse: 93  Resp: 16  Temp: (!) 96.9 F (36.1 C)  TempSrc: Temporal  Weight: 202 lb (91.6 kg)  Height: 5\' 6"  (1.676 m)  Body mass index is 32.6 kg/m.   Physical Exam Constitutional:      Appearance: Normal appearance.  Cardiovascular:     Rate and Rhythm: Normal rate and regular rhythm.     Heart sounds: Normal heart sounds.  Pulmonary:     Effort: Pulmonary effort is normal.     Breath sounds: Normal breath sounds.  Skin:    General: Skin is warm  and dry.  Neurological:     Mental Status: He is alert and oriented to person, place, and time. Mental status is at baseline.  Psychiatric:        Mood and Affect: Mood normal.        Behavior: Behavior normal.      Results for orders placed or performed in visit on 03/05/19  POCT glycosylated hemoglobin (Hb A1C)  Result Value Ref Range   Hemoglobin A1C 7.6 (A) 4.0 - 5.6 %   Est. average glucose Bld gHb Est-mCnc 171        Assessment & Plan    1. Type 2 diabetes mellitus with hyperglycemia, unspecified whether long term insulin use (Garrett Morse)  Explained body will return to set point after taking farxiga daily. However, patient would like to try a different medication. We will switch farxiga to Januvia as below. Labs today and see him back in 3-4 months. Referral placed for  eye exam.   - POCT glycosylated hemoglobin (Hb A1C) - Semaglutide, 1 MG/DOSE, (OZEMPIC, 1 MG/DOSE,) 2 MG/1.5ML SOPN; Inject 1 mg into the skin once a week.  Dispense: 9 mL; Refill: 0 - sitaGLIPtin (JANUVIA) 100 MG tablet; Take 1 tablet (100 mg total) by mouth daily.  Dispense: 90 tablet; Refill: 0 - Lipid Profile - Comprehensive Metabolic Panel (CMET) - Ambulatory referral to Ophthalmology  The entirety of the information documented in the History of Present Illness, Review of Systems and Physical Exam were personally obtained by me. Portions of this information were initially documented by Garrett Morse, CMA and reviewed by me for thoroughness and accuracy.      Garrett Post, PA-C  [facility]

## 2019-03-05 ENCOUNTER — Encounter: Payer: Self-pay | Admitting: Physician Assistant

## 2019-03-05 ENCOUNTER — Ambulatory Visit (INDEPENDENT_AMBULATORY_CARE_PROVIDER_SITE_OTHER): Payer: No Typology Code available for payment source | Admitting: Physician Assistant

## 2019-03-05 ENCOUNTER — Other Ambulatory Visit: Payer: Self-pay

## 2019-03-05 VITALS — BP 117/84 | HR 93 | Temp 96.9°F | Resp 16 | Ht 66.0 in | Wt 202.0 lb

## 2019-03-05 DIAGNOSIS — E1165 Type 2 diabetes mellitus with hyperglycemia: Secondary | ICD-10-CM

## 2019-03-05 LAB — POCT GLYCOSYLATED HEMOGLOBIN (HGB A1C)
Est. average glucose Bld gHb Est-mCnc: 171
Hemoglobin A1C: 7.6 % — AB (ref 4.0–5.6)

## 2019-03-05 MED ORDER — OZEMPIC (1 MG/DOSE) 2 MG/1.5ML ~~LOC~~ SOPN: 1.0000 mg | PEN_INJECTOR | SUBCUTANEOUS | 0 refills | Status: DC

## 2019-03-05 MED ORDER — SITAGLIPTIN PHOSPHATE 100 MG PO TABS: 100.0000 mg | ORAL_TABLET | Freq: Every day | ORAL | 0 refills | Status: DC

## 2019-03-05 NOTE — Patient Instructions (Signed)
Diabetes Mellitus and Exercise Exercising regularly is important for your overall health, especially when you have diabetes (diabetes mellitus). Exercising is not only about losing weight. It has many other health benefits, such as increasing muscle strength and bone density and reducing body fat and stress. This leads to improved fitness, flexibility, and endurance, all of which result in better overall health. Exercise has additional benefits for people with diabetes, including:  Reducing appetite.  Helping to lower and control blood glucose.  Lowering blood pressure.  Helping to control amounts of fatty substances (lipids) in the blood, such as cholesterol and triglycerides.  Helping the body to respond better to insulin (improving insulin sensitivity).  Reducing how much insulin the body needs.  Decreasing the risk for heart disease by: ? Lowering cholesterol and triglyceride levels. ? Increasing the levels of good cholesterol. ? Lowering blood glucose levels. What is my activity plan? Your health care provider or certified diabetes educator can help you make a plan for the type and frequency of exercise (activity plan) that works for you. Make sure that you:  Do at least 150 minutes of moderate-intensity or vigorous-intensity exercise each week. This could be brisk walking, biking, or water aerobics. ? Do stretching and strength exercises, such as yoga or weightlifting, at least 2 times a week. ? Spread out your activity over at least 3 days of the week.  Get some form of physical activity every day. ? Do not go more than 2 days in a row without some kind of physical activity. ? Avoid being inactive for more than 30 minutes at a time. Take frequent breaks to walk or stretch.  Choose a type of exercise or activity that you enjoy, and set realistic goals.  Start slowly, and gradually increase the intensity of your exercise over time. What do I need to know about managing my  diabetes?   Check your blood glucose before and after exercising. ? If your blood glucose is 240 mg/dL (13.3 mmol/L) or higher before you exercise, check your urine for ketones. If you have ketones in your urine, do not exercise until your blood glucose returns to normal. ? If your blood glucose is 100 mg/dL (5.6 mmol/L) or lower, eat a snack containing 15-20 grams of carbohydrate. Check your blood glucose 15 minutes after the snack to make sure that your level is above 100 mg/dL (5.6 mmol/L) before you start your exercise.  Know the symptoms of low blood glucose (hypoglycemia) and how to treat it. Your risk for hypoglycemia increases during and after exercise. Common symptoms of hypoglycemia can include: ? Hunger. ? Anxiety. ? Sweating and feeling clammy. ? Confusion. ? Dizziness or feeling light-headed. ? Increased heart rate or palpitations. ? Blurry vision. ? Tingling or numbness around the mouth, lips, or tongue. ? Tremors or shakes. ? Irritability.  Keep a rapid-acting carbohydrate snack available before, during, and after exercise to help prevent or treat hypoglycemia.  Avoid injecting insulin into areas of the body that are going to be exercised. For example, avoid injecting insulin into: ? The arms, when playing tennis. ? The legs, when jogging.  Keep records of your exercise habits. Doing this can help you and your health care provider adjust your diabetes management plan as needed. Write down: ? Food that you eat before and after you exercise. ? Blood glucose levels before and after you exercise. ? The type and amount of exercise you have done. ? When your insulin is expected to peak, if you use   insulin. Avoid exercising at times when your insulin is peaking.  When you start a new exercise or activity, work with your health care provider to make sure the activity is safe for you, and to adjust your insulin, medicines, or food intake as needed.  Drink plenty of water while  you exercise to prevent dehydration or heat stroke. Drink enough fluid to keep your urine clear or pale yellow. Summary  Exercising regularly is important for your overall health, especially when you have diabetes (diabetes mellitus).  Exercising has many health benefits, such as increasing muscle strength and bone density and reducing body fat and stress.  Your health care provider or certified diabetes educator can help you make a plan for the type and frequency of exercise (activity plan) that works for you.  When you start a new exercise or activity, work with your health care provider to make sure the activity is safe for you, and to adjust your insulin, medicines, or food intake as needed. This information is not intended to replace advice given to you by your health care provider. Make sure you discuss any questions you have with your health care provider. Document Released: 07/22/2003 Document Revised: 11/23/2016 Document Reviewed: 10/11/2015 Elsevier Patient Education  2020 Elsevier Inc.  

## 2019-03-06 LAB — COMPREHENSIVE METABOLIC PANEL
ALT: 29 IU/L (ref 0–44)
AST: 23 IU/L (ref 0–40)
Albumin/Globulin Ratio: 1.3 (ref 1.2–2.2)
Albumin: 4.4 g/dL (ref 4.0–5.0)
Alkaline Phosphatase: 84 IU/L (ref 39–117)
BUN/Creatinine Ratio: 21 — ABNORMAL HIGH (ref 9–20)
BUN: 20 mg/dL (ref 6–20)
Bilirubin Total: 0.3 mg/dL (ref 0.0–1.2)
CO2: 21 mmol/L (ref 20–29)
Calcium: 9.7 mg/dL (ref 8.7–10.2)
Chloride: 101 mmol/L (ref 96–106)
Creatinine, Ser: 0.96 mg/dL (ref 0.76–1.27)
GFR calc Af Amer: 120 mL/min/{1.73_m2} (ref >59)
GFR calc non Af Amer: 104 mL/min/{1.73_m2} (ref >59)
Globulin, Total: 3.4 g/dL (ref 1.5–4.5)
Glucose: 192 mg/dL — ABNORMAL HIGH (ref 65–99)
Potassium: 4.3 mmol/L (ref 3.5–5.2)
Sodium: 138 mmol/L (ref 134–144)
Total Protein: 7.8 g/dL (ref 6.0–8.5)

## 2019-03-06 LAB — LIPID PANEL
Chol/HDL Ratio: 3.7 ratio (ref 0.0–5.0)
Cholesterol, Total: 205 mg/dL — ABNORMAL HIGH (ref 100–199)
HDL: 56 mg/dL (ref >39)
LDL Chol Calc (NIH): 128 mg/dL — ABNORMAL HIGH (ref 0–99)
Triglycerides: 118 mg/dL (ref 0–149)
VLDL Cholesterol Cal: 21 mg/dL (ref 5–40)

## 2019-03-30 ENCOUNTER — Other Ambulatory Visit: Payer: Self-pay | Admitting: Physician Assistant

## 2019-03-30 DIAGNOSIS — E1165 Type 2 diabetes mellitus with hyperglycemia: Secondary | ICD-10-CM

## 2019-05-06 ENCOUNTER — Other Ambulatory Visit: Payer: Self-pay | Admitting: Physician Assistant

## 2019-05-06 DIAGNOSIS — E1165 Type 2 diabetes mellitus with hyperglycemia: Secondary | ICD-10-CM

## 2019-05-06 MED ORDER — OZEMPIC (1 MG/DOSE) 2 MG/1.5ML ~~LOC~~ SOPN: 1.0000 mg | PEN_INJECTOR | SUBCUTANEOUS | 3 refills | Status: DC

## 2019-05-29 ENCOUNTER — Encounter: Payer: Self-pay | Admitting: Physician Assistant

## 2019-05-29 DIAGNOSIS — E1165 Type 2 diabetes mellitus with hyperglycemia: Secondary | ICD-10-CM

## 2019-05-30 MED ORDER — OZEMPIC (1 MG/DOSE) 2 MG/1.5ML ~~LOC~~ SOPN: 1.0000 mg | PEN_INJECTOR | SUBCUTANEOUS | 3 refills | Status: AC

## 2019-06-04 NOTE — Progress Notes (Signed)
mmm      Patient: Garrett Morse Male    DOB: March 23, 1986   34 y.o.   MRN: [id number] Visit Date: 06/05/2019  Today's Provider: Trinna Post, PA-C   Chief Complaint  Patient presents with  . Diabetes   Subjective:    I, Porsha McClurkin,CMA am acting as a scribe for [employer].    HPI  Diabetes Mellitus Type II, Follow-up:   Lab Results  Component Value Date   HGBA1C 7.6 (A) 03/05/2019   HGBA1C 7.2 (A) 11/29/2018   HGBA1C 8.4 (A) 08/30/2018    Last seen for diabetes 3 months ago.  Management since then includes switched [place] to [place].Marland Kitchen He reports good compliance with treatment. He is not having side effects.  Current symptoms include none and have been stable. Home blood sugar records: 100's-200's  Episodes of hypoglycemia? no   Current insulin regiment: Is not on insulin Most Recent Eye Exam: next appt 06/2019, scheduled [facility]  Weight trend: stable Prior visit with dietician: No Current exercise: walking and weightlifting Current diet habits: well balanced  Pertinent Labs:    Component Value Date/Time   CHOL 205 (H) 03/05/2019 0852   TRIG 118 03/05/2019 0852   HDL 56 03/05/2019 0852   LDLCALC 128 (H) 03/05/2019 0852   CREATININE 0.96 03/05/2019 0852    Wt Readings from Last 3 Encounters:  06/05/19 213 lb (96.6 kg)  03/05/19 202 lb (91.6 kg)  11/29/18 203 lb (92.1 kg)   BP Readings from Last 3 Encounters:  06/05/19 138/86  03/05/19 117/84  11/29/18 122/76   Lipid Panel     Component Value Date/Time   CHOL 205 (H) 03/05/2019 0852   TRIG 118 03/05/2019 0852   HDL 56 03/05/2019 0852   CHOLHDL 3.7 03/05/2019 0852   LDLCALC 128 (H) 03/05/2019 0852   LABVLDL 21 03/05/2019 0852     ------------------------------------------------------------------------    Allergies  Allergen Reactions  . Penicillins Hives     Current Outpatient Medications:  .  fexofenadine (ALLEGRA ALLERGY) 180 MG tablet, Take 180 mg by mouth  daily., Disp: , Rfl:  .  fluticasone (FLONASE) 50 MCG/ACT nasal spray, Place 2 sprays into both nostrils daily., Disp: 16 g, Rfl: 6 .  Semaglutide, 1 MG/DOSE, (OZEMPIC, 1 MG/DOSE,) 2 MG/1.5ML SOPN, Inject 1 mg into the skin once a week., Disp: 9 mL, Rfl: 3 .  sitaGLIPtin (JANUVIA) 100 MG tablet, Take 1 tablet (100 mg total) by mouth daily., Disp: 90 tablet, Rfl: 1  Review of Systems  Constitutional: Negative.   HENT: Negative.   Eyes: Negative.   Respiratory: Negative.   Cardiovascular: Negative.   Gastrointestinal: Negative.   Endocrine: Negative.   Genitourinary: Negative.   Musculoskeletal: Negative.   Skin: Negative.   Allergic/Immunologic: Negative.   Neurological: Negative.   Hematological: Negative.   Psychiatric/Behavioral: Negative.     Social History   Tobacco Use  . Smoking status: Never Smoker  . Smokeless tobacco: Never Used  Substance Use Topics  . Alcohol use: Yes    Comment: Rarely      Objective:   BP 138/86 (BP Location: Left Arm, Patient Position: Sitting, Cuff Size: Normal)   Pulse 85   Temp (!) 96.9 F (36.1 C) (Temporal)   Wt 213 lb (96.6 kg)   SpO2 96%   BMI 34.38 kg/m  Vitals:   06/05/19 0824  BP: 138/86  Pulse: 85  Temp: (!) 96.9 F (36.1 C)  TempSrc: Temporal  SpO2: 96%  Weight: 213  lb (96.6 kg)  Body mass index is 34.38 kg/m.   Physical Exam Constitutional:      Appearance: Normal appearance. He is obese.  Cardiovascular:     Rate and Rhythm: Normal rate and regular rhythm.     Heart sounds: Normal heart sounds.  Pulmonary:     Effort: Pulmonary effort is normal.     Breath sounds: Normal breath sounds.  Skin:    General: Skin is warm and dry.  Neurological:     Mental Status: He is alert and oriented to person, place, and time. Mental status is at baseline.  Psychiatric:        Mood and Affect: Mood normal.        Behavior: Behavior normal.      No results found for any visits on 06/05/19.     Assessment & Plan      1. Type 2 diabetes mellitus with hyperglycemia, unspecified whether long term insulin use (HCC)  Uncontrolled, will add glipizide and see back in 3 months. Work on diet and exercise.   - POCT glycosylated hemoglobin (Hb A1C) - sitaGLIPtin (JANUVIA) 100 MG tablet; Take 1 tablet (100 mg total) by mouth daily.  Dispense: 90 tablet; Refill: 1 - glipiZIDE (GLUCOTROL XL) 5 MG 24 hr tablet; Take 1 tablet (5 mg total) by mouth daily with breakfast.  Dispense: 90 tablet; Refill: 0  The entirety of the information documented in the History of Present Illness, Review of Systems and Physical Exam were personally obtained by me. Portions of this information were initially documented by [facility] and reviewed by me for thoroughness and accuracy.       [name], PA-C  [facility]

## 2019-06-05 ENCOUNTER — Encounter: Payer: Self-pay | Admitting: Physician Assistant

## 2019-06-05 ENCOUNTER — Other Ambulatory Visit: Payer: Self-pay

## 2019-06-05 ENCOUNTER — Ambulatory Visit (INDEPENDENT_AMBULATORY_CARE_PROVIDER_SITE_OTHER): Payer: No Typology Code available for payment source | Admitting: Physician Assistant

## 2019-06-05 VITALS — BP 138/86 | HR 85 | Temp 96.9°F | Wt 213.0 lb

## 2019-06-05 DIAGNOSIS — E1165 Type 2 diabetes mellitus with hyperglycemia: Secondary | ICD-10-CM

## 2019-06-05 LAB — POCT GLYCOSYLATED HEMOGLOBIN (HGB A1C)
Est. average glucose Bld gHb Est-mCnc: 183
Hemoglobin A1C: 8 % — AB (ref 4.0–5.6)

## 2019-06-05 MED ORDER — GLIPIZIDE ER 5 MG PO TB24: 5.0000 mg | ORAL_TABLET | Freq: Every day | ORAL | 0 refills | Status: DC

## 2019-06-05 MED ORDER — SITAGLIPTIN PHOSPHATE 100 MG PO TABS: 100.0000 mg | ORAL_TABLET | Freq: Every day | ORAL | 1 refills | Status: DC

## 2019-06-05 NOTE — Patient Instructions (Signed)
Diabetes Mellitus and Exercise Exercising regularly is important for your overall health, especially when you have diabetes (diabetes mellitus). Exercising is not only about losing weight. It has many other health benefits, such as increasing muscle strength and bone density and reducing body fat and stress. This leads to improved fitness, flexibility, and endurance, all of which result in better overall health. Exercise has additional benefits for people with diabetes, including:  Reducing appetite.  Helping to lower and control blood glucose.  Lowering blood pressure.  Helping to control amounts of fatty substances (lipids) in the blood, such as cholesterol and triglycerides.  Helping the body to respond better to insulin (improving insulin sensitivity).  Reducing how much insulin the body needs.  Decreasing the risk for heart disease by: ? Lowering cholesterol and triglyceride levels. ? Increasing the levels of good cholesterol. ? Lowering blood glucose levels. What is my activity plan? Your health care provider or certified diabetes educator can help you make a plan for the type and frequency of exercise (activity plan) that works for you. Make sure that you:  Do at least 150 minutes of moderate-intensity or vigorous-intensity exercise each week. This could be brisk walking, biking, or water aerobics. ? Do stretching and strength exercises, such as yoga or weightlifting, at least 2 times a week. ? Spread out your activity over at least 3 days of the week.  Get some form of physical activity every day. ? Do not go more than 2 days in a row without some kind of physical activity. ? Avoid being inactive for more than 30 minutes at a time. Take frequent breaks to walk or stretch.  Choose a type of exercise or activity that you enjoy, and set realistic goals.  Start slowly, and gradually increase the intensity of your exercise over time. What do I need to know about managing my  diabetes?   Check your blood glucose before and after exercising. ? If your blood glucose is 240 mg/dL (13.3 mmol/L) or higher before you exercise, check your urine for ketones. If you have ketones in your urine, do not exercise until your blood glucose returns to normal. ? If your blood glucose is 100 mg/dL (5.6 mmol/L) or lower, eat a snack containing 15-20 grams of carbohydrate. Check your blood glucose 15 minutes after the snack to make sure that your level is above 100 mg/dL (5.6 mmol/L) before you start your exercise.  Know the symptoms of low blood glucose (hypoglycemia) and how to treat it. Your risk for hypoglycemia increases during and after exercise. Common symptoms of hypoglycemia can include: ? Hunger. ? Anxiety. ? Sweating and feeling clammy. ? Confusion. ? Dizziness or feeling light-headed. ? Increased heart rate or palpitations. ? Blurry vision. ? Tingling or numbness around the mouth, lips, or tongue. ? Tremors or shakes. ? Irritability.  Keep a rapid-acting carbohydrate snack available before, during, and after exercise to help prevent or treat hypoglycemia.  Avoid injecting insulin into areas of the body that are going to be exercised. For example, avoid injecting insulin into: ? The arms, when playing tennis. ? The legs, when jogging.  Keep records of your exercise habits. Doing this can help you and your health care provider adjust your diabetes management plan as needed. Write down: ? Food that you eat before and after you exercise. ? Blood glucose levels before and after you exercise. ? The type and amount of exercise you have done. ? When your insulin is expected to peak, if you use   insulin. Avoid exercising at times when your insulin is peaking.  When you start a new exercise or activity, work with your health care provider to make sure the activity is safe for you, and to adjust your insulin, medicines, or food intake as needed.  Drink plenty of water while  you exercise to prevent dehydration or heat stroke. Drink enough fluid to keep your urine clear or pale yellow. Summary  Exercising regularly is important for your overall health, especially when you have diabetes (diabetes mellitus).  Exercising has many health benefits, such as increasing muscle strength and bone density and reducing body fat and stress.  Your health care provider or certified diabetes educator can help you make a plan for the type and frequency of exercise (activity plan) that works for you.  When you start a new exercise or activity, work with your health care provider to make sure the activity is safe for you, and to adjust your insulin, medicines, or food intake as needed. This information is not intended to replace advice given to you by your health care provider. Make sure you discuss any questions you have with your health care provider. Document Revised: 11/23/2016 Document Reviewed: 10/11/2015 Elsevier Patient Education  2020 Elsevier Inc.  

## 2019-07-03 ENCOUNTER — Encounter: Payer: Self-pay | Admitting: Physician Assistant

## 2019-07-04 ENCOUNTER — Telehealth: Payer: Self-pay

## 2019-07-26 IMAGING — CR CHEST - 2 VIEW
1 series · 2 of 2 positions shown · non-contrast
Comparison: None.

CLINICAL DATA: Chest pain

EXAM:
CHEST - 2 VIEW

[Series 1: dg chest 2 view · 0.14mm/px · 2 of 2 slices shown]
[im 1/2]
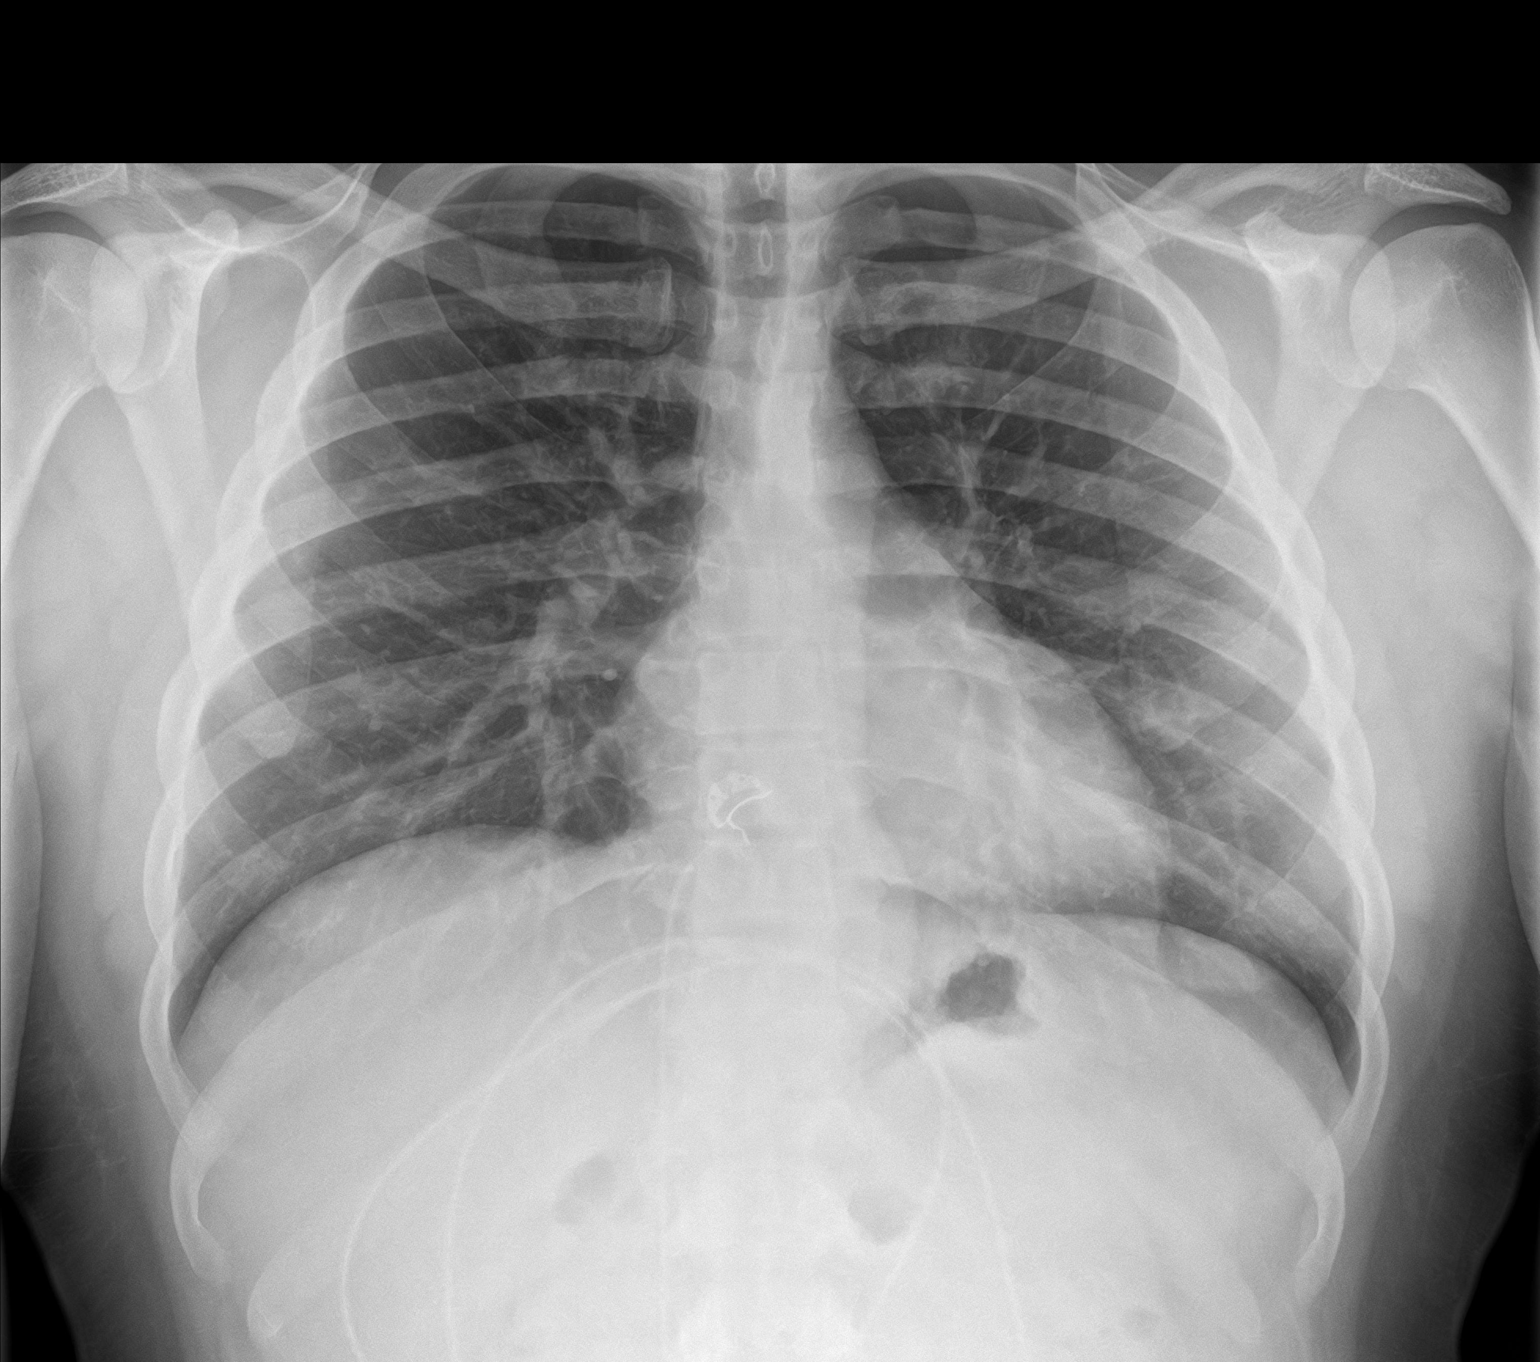
[im 2/2]
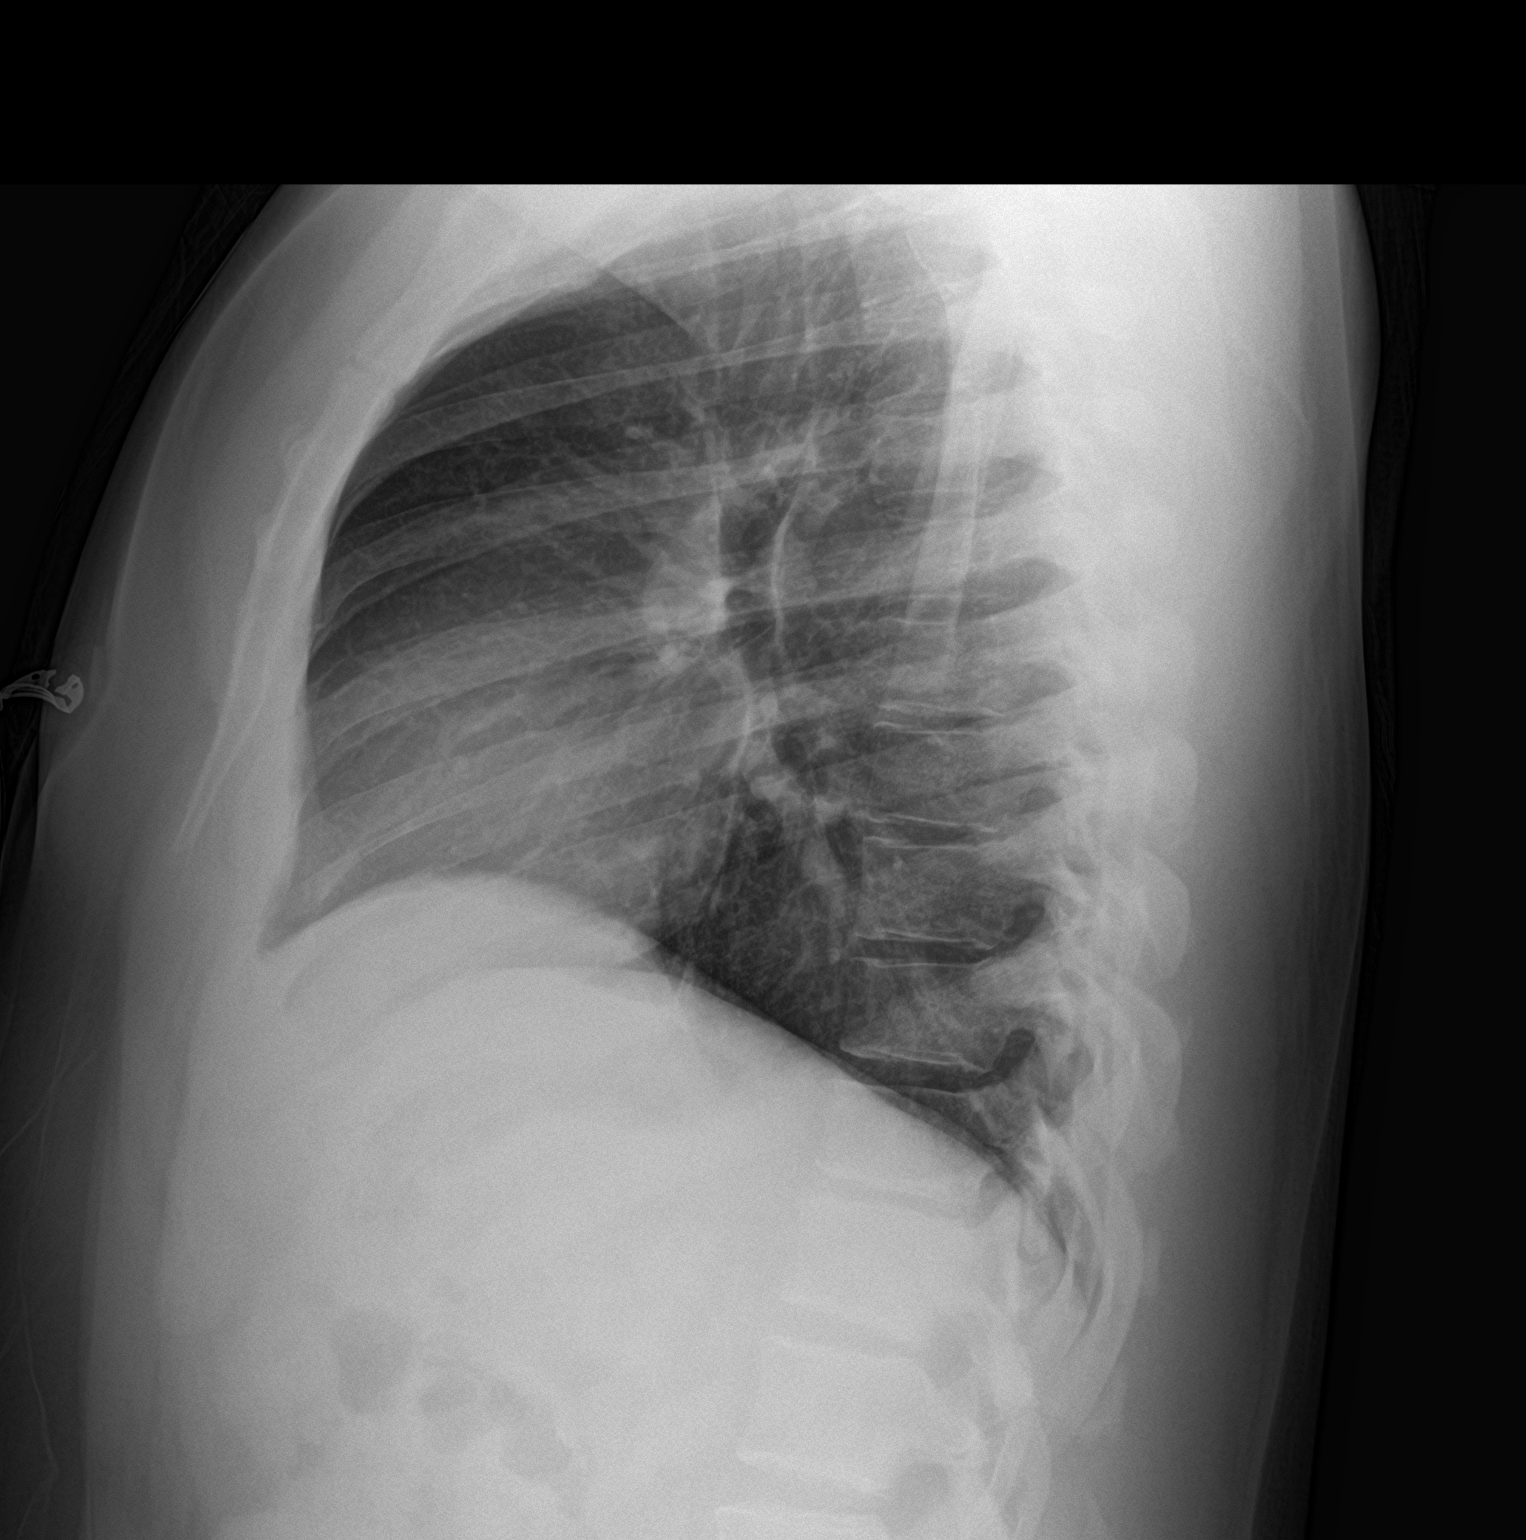

[2 of 2 positions shown; findings below may reference images not displayed]

FINDINGS: The lungs are clear. The heart size and pulmonary vascularity are
normal. No evident adenopathy. There is an apparent small hiatal
hernia. No pneumothorax. No bone lesions.
IMPRESSION: No edema or consolidation.  Apparent small hiatal hernia.

## 2019-08-28 ENCOUNTER — Other Ambulatory Visit: Payer: Self-pay | Admitting: Physician Assistant

## 2019-08-28 DIAGNOSIS — E1165 Type 2 diabetes mellitus with hyperglycemia: Secondary | ICD-10-CM

## 2019-09-01 NOTE — Progress Notes (Signed)
Established patient visit    Patient: Garrett Morse   DOB: [birth year]   34 y.o. Male  MRN: [id number] Visit Date: 09/05/2019  Today's healthcare provider: Trinna Post, PA-C   Chief Complaint  Patient presents with  . Diabetes   Subjective    HPI Diabetes Mellitus Type II, Follow-up  Lab Results  Component Value Date   HGBA1C 8.0 (A) 06/05/2019   HGBA1C 7.6 (A) 03/05/2019   HGBA1C 7.2 (A) 11/29/2018   Last seen for diabetes 3 months ago.  Management since then includes Glipizide 5 MG 24 hr tablet; Take 1 tablet (5 mg total) by mouth daily with breakfast. He was also switched from farxiga to Januvia 100 mg daily. He continues with ozempic 1 mg subQ weekly.  He reports excellent compliance with treatment. He is not having side effects.  Symptoms: No fatigue No foot ulcerations No appetite changes No nausea No paresthesia (numbness or tingling) of the feet  No polydipsia (excessive thirst) No polyuria (frequent urination) No visual disturbances  No vomiting  Home blood sugar records: 170 after eating, 120 when fasting  Episodes of hypoglycemia? No    Current insulin regiment: none Most Recent Eye Exam: >1 year, patient reports that he has a scheduled eye exam in 11/2019.  Current exercise: weightlifting and cardio Current diet habits: well balanced  Pertinent Labs: Lab Results  Component Value Date   CHOL 205 (H) 03/05/2019   HDL 56 03/05/2019   LDLCALC 128 (H) 03/05/2019   TRIG 118 03/05/2019   CHOLHDL 3.7 03/05/2019   Lab Results  Component Value Date   NA 138 03/05/2019   K 4.3 03/05/2019   CL 101 03/05/2019   CO2 21 03/05/2019   GLUCOSE 192 (H) 03/05/2019   BUN 20 03/05/2019   CREATININE 0.96 03/05/2019   CALCIUM 9.7 03/05/2019   GFRNONAA 104 03/05/2019   GFRAA 120 03/05/2019     Wt Readings from Last 3 Encounters:  09/05/19 210 lb 6.4 oz (95.4 kg)  06/05/19 213 lb (96.6 kg)  03/05/19 202 lb (91.6 kg)   Lipid/Cholesterol,  Follow-up  Last lipid panel Other pertinent labs  Lab Results  Component Value Date   CHOL 205 (H) 03/05/2019   HDL 56 03/05/2019   LDLCALC 128 (H) 03/05/2019   TRIG 118 03/05/2019   CHOLHDL 3.7 03/05/2019   Lab Results  Component Value Date   ALT 29 03/05/2019   AST 23 03/05/2019   PLT 309 09/10/2018     He was last seen for this 3 months ago.  Management since that visit includes none.  He reports excellent compliance with treatment. He is not having side effects.  Symptoms: No chest pain No chest pressure/discomfort No dyspnea No lower extremity edema No numbness or tingling of extremity No orthopnea No palpitations No paroxysmal nocturnal dyspnea No speech difficulty No syncope  Current diet: in general, a "healthy" diet   Current exercise: aerobics  Wt Readings from Last 3 Encounters:  09/05/19 210 lb 6.4 oz (95.4 kg)  06/05/19 213 lb (96.6 kg)  03/05/19 202 lb (91.6 kg)   The ASCVD Risk score Mikey Bussing DC Jr., et al., 2013) failed to calculate for the following reasons:   The 2013 ASCVD risk score is only valid for ages 14 to 24  ----------------------------------------------------------------------------------------- -----------------------------------------------------------------------------------------  There are no problems to display for this patient.  Social History   Tobacco Use  . Smoking status: Never Smoker  . Smokeless tobacco: Never Used  Substance Use  Topics  . Alcohol use: Yes    Comment: Rarely  . Drug use: Never   Family History  Problem Relation Age of Onset  . Diabetes Mother   . Diverticulosis Mother   . Pancreatitis Mother   . Hypertension Father   . Healthy Brother   . Cataracts Maternal Grandmother   . Diabetes Maternal Grandmother   . Hypertension Maternal Grandmother   . Hypertension Maternal Grandfather   . Congestive Heart Failure Maternal Grandfather    Allergies  Allergen Reactions  . Penicillins Hives        Medications: Outpatient Medications Prior to Visit  Medication Sig  . fexofenadine (ALLEGRA ALLERGY) 180 MG tablet Take 180 mg by mouth daily.  . fluticasone (FLONASE) 50 MCG/ACT nasal spray Place 2 sprays into both nostrils daily.  Marland Kitchen glipiZIDE (GLUCOTROL XL) 5 MG 24 hr tablet TAKE 1 TABLET BY MOUTH EVERY DAY WITH BREAKFAST  . OZEMPIC, 1 MG/DOSE, 2 MG/1.5ML SOPN Inject 1 mg into the skin once a week.  . sitaGLIPtin (JANUVIA) 100 MG tablet Take 1 tablet (100 mg total) by mouth daily.   No facility-administered medications prior to visit.    Review of Systems  Constitutional: Negative.   Respiratory: Negative.   Cardiovascular: Negative.   Genitourinary: Negative.   Hematological: Negative.     Last CBC Lab Results  Component Value Date   WBC 5.4 09/10/2018   HGB 14.3 09/10/2018   HCT 43.2 09/10/2018   MCV 85.2 09/10/2018   MCH 28.2 09/10/2018   RDW 12.7 09/10/2018   PLT 309 09/10/2018   Last lipids Lab Results  Component Value Date   CHOL 205 (H) 03/05/2019   HDL 56 03/05/2019   LDLCALC 128 (H) 03/05/2019   TRIG 118 03/05/2019   CHOLHDL 3.7 03/05/2019   Last hemoglobin A1c Lab Results  Component Value Date   HGBA1C 8.0 (A) 06/05/2019       Objective    BP 122/78   Pulse 88   Temp (!) 96.6 F (35.9 C) (Oral)   Resp 16   Wt 210 lb 6.4 oz (95.4 kg)   SpO2 99%   BMI 33.96 kg/m    Physical Exam Vitals reviewed. Exam conducted with a chaperone present.  Constitutional:      Appearance: Normal appearance.  HENT:     Head: Normocephalic.  Cardiovascular:     Rate and Rhythm: Normal rate and regular rhythm.     Heart sounds: Normal heart sounds.  Pulmonary:     Effort: Pulmonary effort is normal.     Breath sounds: Normal breath sounds.  Skin:    General: Skin is warm and dry.     Coloration: Pallor: adjust.  Neurological:     Mental Status: He is alert and oriented to person, place, and time. Mental status is at baseline.  Psychiatric:         Mood and Affect: Mood normal.        Behavior: Behavior normal.       No results found for any visits on 09/05/19.   Assessment & Plan    1. Type 2 diabetes mellitus with hyperglycemia, unspecified whether long term insulin use (HCC) uncontrolled with last A1c 8.0%. Rechecking A1C today after starting Januvia and Glipizide  adjusting medications pending lab results.  UTD on vaccines, eye exam scheduled for 11/2019, foot exam up to date.   On Statin Discussed diet and exercise F/u in 3 months  - Comprehensive metabolic panel - Hemoglobin A1c  2. Hyperlipidemia, unspecified hyperlipidemia type Reviewed last lipid panel Not currently on a statin, start statin today. Recheck HgbA1C and CMP Discussed diet and exercise, at next visit will recheck Lipid panel - atorvastatin (LIPITOR) 10 MG tablet; Take 1 tablet (10 mg total) by mouth at bedtime.  Dispense: 90 tablet; Refill: 1   Return in about 3 months (around 12/05/2019) for Annual Physical and Follow Up.      ITrey Sailors, PA-C, have reviewed all documentation for this visit. The documentation on 09/05/19 for the exam, diagnosis, procedures, and orders are all accurate and complete.    Maryella Shivers  [facility] [phone] (phone) [phone] (fax)  [facility]

## 2019-09-05 ENCOUNTER — Other Ambulatory Visit: Payer: Self-pay

## 2019-09-05 ENCOUNTER — Encounter: Payer: Self-pay | Admitting: Physician Assistant

## 2019-09-05 ENCOUNTER — Ambulatory Visit (INDEPENDENT_AMBULATORY_CARE_PROVIDER_SITE_OTHER): Payer: No Typology Code available for payment source | Admitting: Physician Assistant

## 2019-09-05 VITALS — BP 122/78 | HR 88 | Temp 96.6°F | Resp 16 | Wt 210.4 lb

## 2019-09-05 DIAGNOSIS — E1165 Type 2 diabetes mellitus with hyperglycemia: Secondary | ICD-10-CM | POA: Diagnosis not present

## 2019-09-05 DIAGNOSIS — E785 Hyperlipidemia, unspecified: Secondary | ICD-10-CM

## 2019-09-05 MED ORDER — ATORVASTATIN CALCIUM 10 MG PO TABS: 10.0000 mg | ORAL_TABLET | Freq: Every day | ORAL | 1 refills | Status: DC

## 2019-09-05 NOTE — Patient Instructions (Signed)
Fat and Cholesterol Restricted Eating Plan Getting too much fat and cholesterol in your diet may cause health problems. Choosing the right foods helps keep your fat and cholesterol at normal levels. This can keep you from getting certain diseases. Your doctor may recommend an eating plan that includes:  Total fat: ______% or less of total calories a day.  Saturated fat: ______% or less of total calories a day.  Cholesterol: less than _________mg a day.  Fiber: ______g a day. What are tips for following this plan? Meal planning  At meals, divide your plate into four equal parts: ? Fill one-half of your plate with vegetables and green salads. ? Fill one-fourth of your plate with whole grains. ? Fill one-fourth of your plate with low-fat (lean) protein foods.  Eat fish that is high in omega-3 fats at least two times a week. This includes mackerel, tuna, sardines, and salmon.  Eat foods that are high in fiber, such as whole grains, beans, apples, broccoli, carrots, peas, and barley. General tips   Work with your doctor to lose weight if you need to.  Avoid: ? Foods with added sugar. ? Fried foods. ? Foods with partially hydrogenated oils.  Limit alcohol intake to no more than 1 drink a day for nonpregnant women and 2 drinks a day for men. One drink equals 12 oz of beer, 5 oz of wine, or 1 oz of hard liquor. Reading food labels  Check food labels for: ? Trans fats. ? Partially hydrogenated oils. ? Saturated fat (g) in each serving. ? Cholesterol (mg) in each serving. ? Fiber (g) in each serving.  Choose foods with healthy fats, such as: ? Monounsaturated fats. ? Polyunsaturated fats. ? Omega-3 fats.  Choose grain products that have whole grains. Look for the word "whole" as the first word in the ingredient list. Cooking  Cook foods using low-fat methods. These include baking, boiling, grilling, and broiling.  Eat more home-cooked foods. Eat at restaurants and buffets  less often.  Avoid cooking using saturated fats, such as butter, cream, palm oil, palm kernel oil, and coconut oil. Recommended foods  Fruits  All fresh, canned (in natural juice), or frozen fruits. Vegetables  Fresh or frozen vegetables (raw, steamed, roasted, or grilled). Green salads. Grains  Whole grains, such as whole wheat or whole grain breads, crackers, cereals, and pasta. Unsweetened oatmeal, bulgur, barley, quinoa, or brown rice. Corn or whole wheat flour tortillas. Meats and other protein foods  Ground beef (85% or leaner), grass-fed beef, or beef trimmed of fat. Skinless chicken or turkey. Ground chicken or turkey. Pork trimmed of fat. All fish and seafood. Egg whites. Dried beans, peas, or lentils. Unsalted nuts or seeds. Unsalted canned beans. Nut butters without added sugar or oil. Dairy  Low-fat or nonfat dairy products, such as skim or 1% milk, 2% or reduced-fat cheeses, low-fat and fat-free ricotta or cottage cheese, or plain low-fat and nonfat yogurt. Fats and oils  Tub margarine without trans fats. Light or reduced-fat mayonnaise and salad dressings. Avocado. Olive, canola, sesame, or safflower oils. The items listed above may not be a complete list of foods and beverages you can eat. Contact a dietitian for more information. Foods to avoid Fruits  Canned fruit in heavy syrup. Fruit in cream or butter sauce. Fried fruit. Vegetables  Vegetables cooked in cheese, cream, or butter sauce. Fried vegetables. Grains  White bread. White pasta. White rice. Cornbread. Bagels, pastries, and croissants. Crackers and snack foods that contain trans fat   and hydrogenated oils. Meats and other protein foods  Fatty cuts of meat. Ribs, chicken wings, bacon, sausage, bologna, salami, chitterlings, fatback, hot dogs, bratwurst, and packaged lunch meats. Liver and organ meats. Whole eggs and egg yolks. Chicken and [place] with skin. Fried meat. Dairy  Whole or 2% milk, cream,  half-and-half, and cream cheese. Whole milk cheeses. Whole-fat or sweetened yogurt. Full-fat cheeses. Nondairy creamers and whipped toppings. Processed cheese, cheese spreads, and cheese curds. Beverages  Alcohol. Sugar-sweetened drinks such as sodas, lemonade, and fruit drinks. Fats and oils  Butter, stick margarine, lard, shortening, ghee, or bacon fat. Coconut, palm kernel, and palm oils. Sweets and desserts  Corn syrup, sugars, honey, and molasses. Candy. Jam and jelly. Syrup. Sweetened cereals. Cookies, pies, cakes, donuts, muffins, and ice cream. The items listed above may not be a complete list of foods and beverages you should avoid. Contact a dietitian for more information. Summary  Choosing the right foods helps keep your fat and cholesterol at normal levels. This can keep you from getting certain diseases.  At meals, fill one-half of your plate with vegetables and green salads.  Eat high-fiber foods, like whole grains, beans, apples, carrots, peas, and barley.  Limit added sugar, saturated fats, alcohol, and fried foods. This information is not intended to replace advice given to you by your health care provider. Make sure you discuss any questions you have with your health care provider. Document Revised: 01/02/2018 Document Reviewed: 01/16/2017 Elsevier Patient Education  2020 Elsevier Inc. Preventing High Cholesterol Cholesterol is a white, waxy substance similar to fat that the human body needs to help build cells. The liver makes all the cholesterol that a person's body needs. Having high cholesterol (hypercholesterolemia) increases a person's risk for heart disease and stroke. Extra (excess) cholesterol comes from the food the person eats. High cholesterol can often be prevented with diet and lifestyle changes. If you already have high cholesterol, you can control it with diet and lifestyle changes and with medicine. How can high cholesterol affect me? If you have high  cholesterol, deposits (plaques) may build up on the walls of your arteries. The arteries are the blood vessels that carry blood away from your heart. Plaques make the arteries narrower and stiffer. This can limit or block blood flow and cause blood clots to form. Blood clots:  Are tiny balls of cells that form in your blood.  Can move to the heart or brain, causing a heart attack or stroke. Plaques in arteries greatly increase your risk for heart attack and stroke.Making diet and lifestyle changes can reduce your risk for these conditions that may threaten your life. What can increase my risk? This condition is more likely to develop in people who:  Eat foods that are high in saturated fat or cholesterol. Saturated fat is mostly found in: ? Foods that contain animal fat, such as red meat and some dairy products. ? Certain fatty foods made from plants, such as tropical oils.  Are overweight.  Are not getting enough exercise.  Have a family history of high cholesterol. What actions can I take to prevent this? Nutrition   Eat less saturated fat.  Avoid trans fats (partially hydrogenated oils). These are often found in margarine and in some baked goods, fried foods, and snacks bought in packages.  Avoid precooked or cured meat, such as sausages or meat loaves.  Avoid foods and drinks that have added sugars.  Eat more fruits, vegetables, and whole grains.  Choose healthy sources  of protein, such as fish, poultry, lean cuts of red meat, beans, peas, lentils, and nuts.  Choose healthy sources of fat, such as: ? Nuts. ? Vegetable oils, especially olive oil. ? Fish that have healthy fats (omega-3 fatty acids), such as mackerel or salmon. The items listed above may not be a complete list of recommended foods and beverages. Contact a dietitian for more information. Lifestyle  Lose weight if you are overweight. Losing 5-10 lb (2.3-4.5 kg) can help prevent or control high cholesterol. It  can also lower your risk for diabetes and high blood pressure. Ask your health care provider to help you with a diet and exercise plan to lose weight safely.  Do not use any products that contain nicotine or tobacco, such as cigarettes, e-cigarettes, and chewing tobacco. If you need help quitting, ask your health care provider.  Limit your alcohol intake. ? Do not drink alcohol if:  Your health care provider tells you not to drink.  You are pregnant, may be pregnant, or are planning to become pregnant. ? If you drink alcohol:  Limit how much you use to:  0-1 drink a day for women.  0-2 drinks a day for men.  Be aware of how much alcohol is in your drink. In the [place] one drink equals one 12 oz bottle of beer (355 mL), one 5 oz glass of wine (148 mL), or one 1 oz glass of hard liquor (44 mL). Activity   Get enough exercise. Each week, do at least 150 minutes of exercise that takes a medium level of effort (moderate-intensity exercise). ? This is exercise that:  Makes your heart beat faster and makes you breathe harder than usual.  Allows you to still be able to talk. ? You could exercise in short sessions several times a day or longer sessions a few times a week. For example, on 5 days each week, you could walk fast or ride your bike 3 times a day for 10 minutes each time.  Do exercises as told by your health care provider. Medicines  In addition to diet and lifestyle changes, your health care provider may recommend medicines to help lower cholesterol. This may be a medicine to lower the amount of cholesterol your liver makes. You may need medicine if: ? Diet and lifestyle changes do not lower your cholesterol enough. ? You have high cholesterol and other risk factors for heart disease or stroke.  Take over-the-counter and prescription medicines only as told by your health care provider. General information  Manage your risk factors for high cholesterol. Talk with your health  care provider about all your risk factors and how to lower your risk.  Manage other conditions that you have, such as diabetes or high blood pressure (hypertension).  Have blood tests to check your cholesterol levels at regular points in time as told by your health care provider.  Keep all follow-up visits as told by your health care provider. This is important. Where to find more information  American Heart Association: www.heart.org  National Heart, Lung, and Blood Institute: https://wilson-eaton.com/ Summary  High cholesterol increases your risk for heart disease and stroke. By keeping your cholesterol level low, you can reduce your risk for these conditions.  High cholesterol can often be prevented with diet and lifestyle changes.  Work with your health care provider to manage your risk factors, and have your blood tested regularly. This information is not intended to replace advice given to you by your health care provider.  Make sure you discuss any questions you have with your health care provider. Document Revised: 08/23/2018 Document Reviewed: 01/08/2016 Elsevier Patient Education  2020 [employer].

## 2019-09-06 LAB — COMPREHENSIVE METABOLIC PANEL
ALT: 40 IU/L (ref 0–44)
AST: 32 IU/L (ref 0–40)
Albumin/Globulin Ratio: 1.4 (ref 1.2–2.2)
Albumin: 4.2 g/dL (ref 4.0–5.0)
Alkaline Phosphatase: 86 IU/L (ref 39–117)
BUN/Creatinine Ratio: 14 (ref 9–20)
BUN: 13 mg/dL (ref 6–20)
Bilirubin Total: 0.2 mg/dL (ref 0.0–1.2)
CO2: 24 mmol/L (ref 20–29)
Calcium: 9.4 mg/dL (ref 8.7–10.2)
Chloride: 100 mmol/L (ref 96–106)
Creatinine, Ser: 0.92 mg/dL (ref 0.76–1.27)
GFR calc Af Amer: 127 mL/min/{1.73_m2} (ref >59)
GFR calc non Af Amer: 110 mL/min/{1.73_m2} (ref >59)
Globulin, Total: 2.9 g/dL (ref 1.5–4.5)
Glucose: 184 mg/dL — ABNORMAL HIGH (ref 65–99)
Potassium: 4.3 mmol/L (ref 3.5–5.2)
Sodium: 138 mmol/L (ref 134–144)
Total Protein: 7.1 g/dL (ref 6.0–8.5)

## 2019-09-06 LAB — HEMOGLOBIN A1C
Est. average glucose Bld gHb Est-mCnc: 194 mg/dL
Hgb A1c MFr Bld: 8.4 % — ABNORMAL HIGH (ref 4.8–5.6)

## 2019-09-08 MED ORDER — GLIPIZIDE ER 10 MG PO TB24: 10.0000 mg | ORAL_TABLET | Freq: Every day | ORAL | 1 refills | Status: DC

## 2019-09-08 NOTE — Addendum Note (Signed)
Addended by: [name] on: 09/08/2019 04:56 PM   Modules accepted: Orders

## 2019-12-04 NOTE — Progress Notes (Signed)
Complete physical exam   Patient: Garrett AlbeDerrick Sulak   DOB: 05/14/1986   34 y.o. Male  MRN: [id number] Visit Date: 12/05/2019  Today's healthcare provider: Trey SailorsAdriana M Olla Delancey, PA-C   Chief Complaint  Patient presents with  . Annual Exam  . Diabetes  . Hyperlipidemia  I,Arionna Hoggard M Binyomin Brann,acting as a scribe for [employer] Corporationdriana M Octavian Godek, PA-C.,have documented all relevant documentation on the behalf of Trey Sailorsdriana M Deitra Craine, PA-C,as directed by  Trey SailorsAdriana M Umeka Wrench, PA-C while in the presence of Trey SailorsAdriana M Sherica Paternostro, PA-C.  Subjective    Garrett Morse is a 34 y.o. male who presents today for a complete physical exam.  He reports consuming a general diet. Gym/ health club routine includes cardio and mod to heavy weightlifting. He generally feels fairly well. He reports sleeping well. He does have additional problems to discuss today.  HPI  Diabetes Mellitus Type II, Follow-up  Lab Results  Component Value Date   HGBA1C 9.0 (A) 12/05/2019   HGBA1C 8.4 (H) 09/05/2019   HGBA1C 8.0 (A) 06/05/2019   Wt Readings from Last 3 Encounters:  12/05/19 (!) 207 lb 12.8 oz (94.3 kg)  09/05/19 210 lb 6.4 oz (95.4 kg)  06/05/19 213 lb (96.6 kg)   Last seen for diabetes 3 months ago.  Management since then includes no changes. He reports poor compliance with treatment. Patient reports that he is not taking the Januvia & Glipizide and he hasn't in months. Says he moved and then stopped taking them.  He is not having side effects.  Symptoms: No fatigue No foot ulcerations  No appetite changes No nausea  No paresthesia of the feet  No polydipsia  No polyuria No visual disturbances   No vomiting     Home blood sugar records: fasting range: low to mids 100's  Episodes of hypoglycemia? No    Current insulin regiment: none Most Recent Eye Exam: Has not gotten[facility]  Current exercise: running/ jogging and weightlifting Current diet habits: well balanced  Pertinent Labs: Lab  Results  Component Value Date   CHOL 205 (H) 03/05/2019   HDL 56 03/05/2019   LDLCALC 128 (H) 03/05/2019   TRIG 118 03/05/2019   CHOLHDL 3.7 03/05/2019   Lab Results  Component Value Date   NA 138 09/05/2019   K 4.3 09/05/2019   CREATININE 0.92 09/05/2019   GFRNONAA 110 09/05/2019   GFRAA 127 09/05/2019   GLUCOSE 184 (H) 09/05/2019     --------------------------------------------------------------------------------------------------- Lipid/Cholesterol, Follow-up  Last lipid panel Other pertinent labs  Lab Results  Component Value Date   CHOL 205 (H) 03/05/2019   HDL 56 03/05/2019   LDLCALC 128 (H) 03/05/2019   TRIG 118 03/05/2019   CHOLHDL 3.7 03/05/2019   Lab Results  Component Value Date   ALT 40 09/05/2019   AST 32 09/05/2019   PLT 309 09/10/2018     He was last seen for this 3 months ago.  Management since that visit includes started atorvastatin (LIPITOR) 10 MG tablet daily.  He reports good compliance with treatment. He is not having side effects.   Symptoms: No chest pain No chest pressure/discomfort  No dyspnea No lower extremity edema  No numbness or tingling of extremity No orthopnea  No palpitations No paroxysmal nocturnal dyspnea  No speech difficulty No syncope   Current diet: well balanced Current exercise: running/ jogging and weightlifting  The ASCVD Risk score Denman George(Goff DC Jr., et al., 2013) failed to calculate for the following  reasons:   The 2013 ASCVD risk score is only valid for ages 57 to 36  ---------------------------------------------------------------------------------------------------   No past medical history on file. Past Surgical History:  Procedure Laterality Date  . CLOSED REDUCTION HAND FRACTURE Left   . KNEE ARTHROPLASTY Right 2006   Social History   Socioeconomic History  . Marital status: Single    Spouse name: Not on file  . Number of children: Not on file  . Years of education: Not on file  . Highest education  level: Not on file  Occupational History  . Not on file  Tobacco Use  . Smoking status: Never Smoker  . Smokeless tobacco: Never Used  Vaping Use  . Vaping Use: Never used  Substance and Sexual Activity  . Alcohol use: Yes    Comment: Rarely  . Drug use: Never  . Sexual activity: Yes    Partners: Female  Other Topics Concern  . Not on file  Social History Narrative  . Not on file   Social Determinants of Health   Financial Resource Strain:   . Difficulty of Paying Living Expenses:   Food Insecurity:   . Worried About Programme researcher, broadcasting/film/video in the Last Year:   . Barista in the Last Year:   Transportation Needs:   . Freight forwarder (Medical):   Marland Kitchen Lack of Transportation (Non-Medical):   Physical Activity:   . Days of Exercise per Week:   . Minutes of Exercise per Session:   Stress:   . Feeling of Stress :   Social Connections:   . Frequency of Communication with Friends and Family:   . Frequency of Social Gatherings with Friends and Family:   . Attends Religious Services:   . Active Member of Clubs or Organizations:   . Attends Banker Meetings:   Marland Kitchen Marital Status:   Intimate Partner Violence:   . Fear of Current or Ex-Partner:   . Emotionally Abused:   Marland Kitchen Physically Abused:   . Sexually Abused:    Family Status  Relation Name Status  . Mother  Alive  . Father  Alive  . Brother  Alive  . MGM  Deceased  . MGF  Deceased   Family History  Problem Relation Age of Onset  . Diabetes Mother   . Diverticulosis Mother   . Pancreatitis Mother   . Hypertension Father   . Healthy Brother   . Cataracts Maternal Grandmother   . Diabetes Maternal Grandmother   . Hypertension Maternal Grandmother   . Hypertension Maternal Grandfather   . Congestive Heart Failure Maternal Grandfather    Allergies  Allergen Reactions  . Penicillins Hives    Patient Care Team: Maryella Shivers as PCP - General (Physician Assistant)    Medications: Outpatient Medications Prior to Visit  Medication Sig  . fexofenadine (ALLEGRA ALLERGY) 180 MG tablet Take 180 mg by mouth daily.  . fluticasone (FLONASE) 50 MCG/ACT nasal spray Place 2 sprays into both nostrils daily.  . [DISCONTINUED] atorvastatin (LIPITOR) 10 MG tablet Take 1 tablet (10 mg total) by mouth at bedtime.  . [DISCONTINUED] glipiZIDE (GLUCOTROL XL) 10 MG 24 hr tablet Take 1 tablet (10 mg total) by mouth daily with breakfast.  . [DISCONTINUED] OZEMPIC, 1 MG/DOSE, 2 MG/1.5ML SOPN Inject 1 mg into the skin once a week.  . [DISCONTINUED] sitaGLIPtin (JANUVIA) 100 MG tablet Take 1 tablet (100 mg total) by mouth daily.   No facility-administered medications prior to visit.  Review of Systems  Constitutional: Negative.   HENT: Negative.   Eyes: Negative.   Respiratory: Negative.   Cardiovascular: Negative.   Gastrointestinal: Negative.   Endocrine: Negative.   Genitourinary: Negative.   Musculoskeletal: Negative.   Skin: Negative.   Allergic/Immunologic: Positive for environmental allergies.  Neurological: Negative.   Hematological: Negative.   Psychiatric/Behavioral: Negative.       Objective    BP (!) 122/88 (BP Location: Left Arm, Patient Position: Sitting, Cuff Size: Normal)   Pulse 76   Temp (!) 96.9 F (36.1 C) (Temporal)   Ht 5\' 6"  (1.676 m)   Wt (!) 207 lb 12.8 oz (94.3 kg)   SpO2 98%   BMI 33.54 kg/m    Physical Exam Constitutional:      Appearance: Normal appearance.  HENT:     Right Ear: Tympanic membrane, ear canal and external ear normal.     Left Ear: Tympanic membrane, ear canal and external ear normal.  Cardiovascular:     Rate and Rhythm: Normal rate and regular rhythm.     Pulses: Normal pulses.     Heart sounds: Normal heart sounds.  Pulmonary:     Effort: Pulmonary effort is normal.     Breath sounds: Normal breath sounds.  Abdominal:     General: Abdomen is flat. Bowel sounds are normal.     Palpations: Abdomen  is soft.  Musculoskeletal:     Right foot: Normal.     Left foot: Normal.  Skin:    General: Skin is warm and dry.  Neurological:     General: No focal deficit present.     Mental Status: He is alert and oriented to person, place, and time.  Psychiatric:        Mood and Affect: Mood normal.        Behavior: Behavior normal.       Last depression screening scores PHQ 2/9 Scores 12/05/2019 03/05/2019 08/31/2017  PHQ - 2 Score 0 0 0  PHQ- 9 Score 0 - 5   Last fall risk screening Fall Risk  12/05/2019  Falls in the past year? 0  Number falls in past yr: 0  Injury with Fall? 0  Follow up -   Last Audit-C alcohol use screening Alcohol Use Disorder Test (AUDIT) 12/05/2019  1. How often do you have a drink containing alcohol? 2  2. How many drinks containing alcohol do you have on a typical day when you are drinking? 0  3. How often do you have six or more drinks on one occasion? 0  AUDIT-C Score 2   A score of 3 or more in women, and 4 or more in men indicates increased risk for alcohol abuse, EXCEPT if all of the points are from question 1   Results for orders placed or performed in visit on 12/05/19  POCT glycosylated hemoglobin (Hb A1C)  Result Value Ref Range   Hemoglobin A1C 9.0 (A) 4.0 - 5.6 %   HbA1c POC (<> result, manual entry)     HbA1c, POC (prediabetic range)     HbA1c, POC (controlled diabetic range)     Est. average glucose Bld gHb Est-mCnc 212     Assessment & Plan    1. Annual physical exam   2. Type 2 diabetes mellitus with hyperglycemia, unspecified whether long term insulin use (HCC) Uncontrolled with last A1c 9%. Patient is not compliant with medications. Patient has been instructed to set a phone alarm and take medications at a  less chaotic time during the day like lunch or dinner. He needs to be compliant with [place] and glipize.  Continue current medications UTD on vaccines, foot exam. He is not up to date with eye exam. In 2 years of being at this  clinic, he has not gotten an eye exam. He needs to schedule [facility].  On ACEi On Statin Discussed diet and exercise   - POCT glycosylated hemoglobin (Hb A1C) - Microalbumin / creatinine urine ratio - glipiZIDE (GLUCOTROL XL) 10 MG 24 hr tablet; Take 1 tablet (10 mg total) by mouth daily with breakfast.  Dispense: 90 tablet; Refill: 1 - OZEMPIC, 1 MG/DOSE, 2 MG/1.5ML SOPN; Inject 0.75 mLs (1 mg total) into the skin once a week.  Dispense: 9 mL; Refill: 1 - sitaGLIPtin (JANUVIA) 100 MG tablet; Take 1 tablet (100 mg total) by mouth daily.  Dispense: 90 tablet; Refill: 1  3. Hyperlipidemia, unspecified hyperlipidemia type Previously well controlled Continue statin Goal LDL < 70  - atorvastatin (LIPITOR) 10 MG tablet; Take 1 tablet (10 mg total) by mouth at bedtime.  Dispense: 90 tablet; Refill: 1   Routine Health Maintenance and Physical Exam  Exercise Activities and Dietary recommendations Goals   None     Immunization History  Administered Date(s) Administered  . Moderna SARS-COVID-2 Vaccination 08/13/2019  . Pneumococcal Polysaccharide-23 01/04/2018  . Tdap 01/04/2018    Health Maintenance  Topic Date Due  . Hepatitis C Screening  Never done  . OPHTHALMOLOGY EXAM  Never done  . COVID-19 Vaccine (2 - Moderna 2-dose series) 09/10/2019  . FOOT EXAM  11/29/2019  . URINE MICROALBUMIN  12/03/2019  . INFLUENZA VACCINE  12/14/2019  . HEMOGLOBIN A1C  06/06/2020  . TETANUS/TDAP  01/05/2028  . PNEUMOCOCCAL POLYSACCHARIDE VACCINE AGE 76-64 HIGH RISK  Completed  . HIV Screening  Completed    Discussed health benefits of physical activity, and encouraged him to engage in regular exercise appropriate for his age and condition.    Return in about 3 months (around 03/06/2020) for dm.     ITrey Sailors, PA-C, have reviewed all documentation for this visit. The documentation on 12/05/19 for the exam, diagnosis, procedures, and orders are all accurate and  complete.     Maryella Shivers  [facility] [phone] (phone) [phone] (fax)  [facility]

## 2019-12-05 ENCOUNTER — Other Ambulatory Visit: Payer: Self-pay

## 2019-12-05 ENCOUNTER — Encounter: Payer: Self-pay | Admitting: Physician Assistant

## 2019-12-05 ENCOUNTER — Ambulatory Visit: Payer: No Typology Code available for payment source | Admitting: Physician Assistant

## 2019-12-05 VITALS — BP 122/88 | HR 76 | Temp 96.9°F | Ht 66.0 in | Wt 207.8 lb

## 2019-12-05 DIAGNOSIS — Z0000 Encounter for general adult medical examination without abnormal findings: Secondary | ICD-10-CM

## 2019-12-05 DIAGNOSIS — E785 Hyperlipidemia, unspecified: Secondary | ICD-10-CM | POA: Diagnosis not present

## 2019-12-05 DIAGNOSIS — E1165 Type 2 diabetes mellitus with hyperglycemia: Secondary | ICD-10-CM | POA: Diagnosis not present

## 2019-12-05 LAB — POCT GLYCOSYLATED HEMOGLOBIN (HGB A1C)
Est. average glucose Bld gHb Est-mCnc: 212
Hemoglobin A1C: 9 % — AB (ref 4.0–5.6)

## 2019-12-05 MED ORDER — ATORVASTATIN CALCIUM 10 MG PO TABS: 10.0000 mg | ORAL_TABLET | Freq: Every day | ORAL | 1 refills | Status: DC

## 2019-12-05 MED ORDER — OZEMPIC (1 MG/DOSE) 2 MG/1.5ML ~~LOC~~ SOPN: 1.0000 mg | PEN_INJECTOR | SUBCUTANEOUS | 1 refills | Status: AC

## 2019-12-05 MED ORDER — SITAGLIPTIN PHOSPHATE 100 MG PO TABS: 100.0000 mg | ORAL_TABLET | Freq: Every day | ORAL | 1 refills | Status: DC

## 2019-12-05 MED ORDER — GLIPIZIDE ER 10 MG PO TB24: 10.0000 mg | ORAL_TABLET | Freq: Every day | ORAL | 1 refills | Status: DC

## 2019-12-05 NOTE — Patient Instructions (Signed)
Preventive Care 19-33 Years Old, Male Preventive care refers to lifestyle choices and visits with your health care provider that can promote health and wellness. This includes:  A yearly physical exam. This is also called an annual well check.  Regular dental and eye exams.  Immunizations.  Screening for certain conditions.  Healthy lifestyle choices, such as eating a healthy diet, getting regular exercise, not using drugs or products that contain nicotine and tobacco, and limiting alcohol use. What can I expect for my preventive care visit? Physical exam Your health care provider will check:  Height and weight. These may be used to calculate body mass index (BMI), which is a measurement that tells if you are at a healthy weight.  Heart rate and blood pressure.  Your skin for abnormal spots. Counseling Your health care provider may ask you questions about:  Alcohol, tobacco, and drug use.  Emotional well-being.  Home and relationship well-being.  Sexual activity.  Eating habits.  Work and work Statistician. What immunizations do I need?  Influenza (flu) vaccine  This is recommended every year. Tetanus, diphtheria, and pertussis (Tdap) vaccine  You may need a Td booster every 10 years. Varicella (chickenpox) vaccine  You may need this vaccine if you have not already been vaccinated. Human papillomavirus (HPV) vaccine  If recommended by your health care provider, you may need three doses over 6 months. Measles, mumps, and rubella (MMR) vaccine  You may need at least one dose of MMR. You may also need a second dose. Meningococcal conjugate (MenACWY) vaccine  One dose is recommended if you are 45-76 years old and a Market researcher living in a residence hall, or if you have one of several medical conditions. You may also need additional booster doses. Pneumococcal conjugate (PCV13) vaccine  You may need this if you have certain conditions and were not  previously vaccinated. Pneumococcal polysaccharide (PPSV23) vaccine  You may need one or two doses if you smoke cigarettes or if you have certain conditions. Hepatitis A vaccine  You may need this if you have certain conditions or if you travel or work in places where you may be exposed to hepatitis A. Hepatitis B vaccine  You may need this if you have certain conditions or if you travel or work in places where you may be exposed to hepatitis B. Haemophilus influenzae type b (Hib) vaccine  You may need this if you have certain risk factors. You may receive vaccines as individual doses or as more than one vaccine together in one shot (combination vaccines). Talk with your health care provider about the risks and benefits of combination vaccines. What tests do I need? Blood tests  Lipid and cholesterol levels. These may be checked every 5 years starting at age 17.  Hepatitis C test.  Hepatitis B test. Screening   Diabetes screening. This is done by checking your blood sugar (glucose) after you have not eaten for a while (fasting).  Sexually transmitted disease (STD) testing. Talk with your health care provider about your test results, treatment options, and if necessary, the need for more tests. Follow these instructions at home: Eating and drinking   Eat a diet that includes fresh fruits and vegetables, whole grains, lean protein, and low-fat dairy products.  Take vitamin and mineral supplements as recommended by your health care provider.  Do not drink alcohol if your health care provider tells you not to drink.  If you drink alcohol: ? Limit how much you have to 0-2  drinks a day. ? Be aware of how much alcohol is in your drink. In the [place] one drink equals one 12 oz bottle of beer (355 mL), one 5 oz glass of wine (148 mL), or one 1 oz glass of hard liquor (44 mL). Lifestyle  Take daily care of your teeth and gums.  Stay active. Exercise for at least 30 minutes on 5 or  more days each week.  Do not use any products that contain nicotine or tobacco, such as cigarettes, e-cigarettes, and chewing tobacco. If you need help quitting, ask your health care provider.  If you are sexually active, practice safe sex. Use a condom or other form of protection to prevent STIs (sexually transmitted infections). What's next?  Go to your health care provider once a year for a well check visit.  Ask your health care provider how often you should have your eyes and teeth checked.  Stay up to date on all vaccines. This information is not intended to replace advice given to you by your health care provider. Make sure you discuss any questions you have with your health care provider. Document Revised: 04/25/2018 Document Reviewed: 04/25/2018 Elsevier Patient Education  2020 [employer].

## 2019-12-06 LAB — MICROALBUMIN / CREATININE URINE RATIO
Creatinine, Urine: 192.4 mg/dL
Microalb/Creat Ratio: 4 mg/g creat (ref 0–29)
Microalbumin, Urine: 7 ug/mL

## 2020-09-28 NOTE — Telephone Encounter (Signed)
error 

## 2024-04-12 ENCOUNTER — Ambulatory Visit
Admission: EM | Admit: 2024-04-12 | Discharge: 2024-04-12 | Disposition: A | Discharge status: Home / Self Care | Payer: Self-pay | Attending: Family Medicine | Admitting: Family Medicine

## 2024-04-12 ENCOUNTER — Encounter: Payer: Self-pay | Admitting: Emergency Medicine

## 2024-04-12 DIAGNOSIS — M79671 Pain in right foot: Secondary | ICD-10-CM

## 2024-04-12 DIAGNOSIS — Z8639 Personal history of other endocrine, nutritional and metabolic disease: Secondary | ICD-10-CM

## 2024-04-12 DIAGNOSIS — E1165 Type 2 diabetes mellitus with hyperglycemia: Secondary | ICD-10-CM

## 2024-04-12 DIAGNOSIS — M79672 Pain in left foot: Secondary | ICD-10-CM

## 2024-04-12 HISTORY — DX: Type 2 diabetes mellitus without complications: E11.9

## 2024-04-12 LAB — GLUCOSE, POCT (MANUAL RESULT ENTRY): POC Glucose: 380 mg/dL — AB (ref 70–99)

## 2024-04-12 MED ORDER — NAPROXEN 375 MG PO TABS: 375.0000 mg | ORAL_TABLET | Freq: Two times a day (BID) | ORAL | 0 refills | Status: DC | PRN

## 2024-04-12 NOTE — Discharge Instructions (Addendum)
 You may take naproxen twice daily as needed for your foot pain.  Continue inserts in your shoes to help cushion the feet as you do stand on hard surfaces.  Your blood sugar was elevated in the clinic please follow-up with your PCP at your scheduled appointment on December 1 for further evaluation and treatment.  Please go to the ER if you develop any worsening symptoms.  I hope you feel better soon!

## 2024-04-12 NOTE — ED Triage Notes (Addendum)
 Pt c/o bilateral foot pain and tingling on pads of feet x 3 weeks. Pt states he wears steel toe boots and stands for long periods at work. Has been taking Tylenol arthritis with some relief.

## 2024-04-12 NOTE — ED Provider Notes (Addendum)
 [facility]    CSN: [id number] Arrival date & time: 04/12/24  1117      History   Chief Complaint Chief Complaint  Patient presents with   Foot Pain    HPI Garrett Morse is a 38 y.o. male presents for foot pain.  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  He does wear steel toed shoes as well.  Over the past 3 weeks he has had increasing swelling to the balls of both feet with tingling.  States it does somewhat improve after elevation and taking Tylenol.  He denies any numbness or pain to the heel or dorsum of foot.  Denies any erythema of the foot.  No history of gout.  He does have a history of type 2 diabetes last treated about 3 years ago.  He does not currently take any medications and states that sugars have been good after he lost a lot of weight.  He does have a PCP but is in [place] but has not seen them recently.  He has tried taking Tylenol and using shoe inserts and socks with padding with minimal improvement.  No other concerns at this time   Foot Pain    Past Medical History:  Diagnosis Date   Diabetes mellitus without complication (HCC)     There are no active problems to display for this patient.   Past Surgical History:  Procedure Laterality Date   CLOSED REDUCTION HAND FRACTURE Left    KNEE ARTHROPLASTY Right 2006       Home Medications    Prior to Admission medications   Medication Sig Start Date End Date Taking? Authorizing Provider  naproxen (NAPROSYN) 375 MG tablet Take 1 tablet (375 mg total) by mouth 2 (two) times daily as needed. 04/12/24  Yes Karess Harner, Jodi R, NP  atorvastatin  (LIPITOR) 10 MG tablet Take 1 tablet (10 mg total) by mouth at bedtime. 12/05/19   Ashok Kathrine HERO, PA-C  fexofenadine (ALLEGRA ALLERGY) 180 MG tablet Take 180 mg by mouth daily.    [provider]  fluticasone  (FLONASE ) 50 MCG/ACT nasal spray Place 2 sprays into both  nostrils daily. 08/30/18   Ashok Kathrine HERO, PA-C    Family History Family History  Problem Relation Age of Onset   Diabetes Mother    Diverticulosis Mother    Pancreatitis Mother    Hypertension Father    Healthy Brother    Cataracts Maternal Grandmother    Diabetes Maternal Grandmother    Hypertension Maternal Grandmother    Hypertension Maternal Grandfather    Congestive Heart Failure Maternal Grandfather     Social History Social History   Tobacco Use   Smoking status: Never   Smokeless tobacco: Never  Vaping Use   Vaping status: Never Used  Substance Use Topics   Alcohol use: Yes    Comment: Rarely   Drug use: Never     Allergies   Penicillins   Review of Systems Review of Systems  Musculoskeletal:        Bilateral foot pain     Physical Exam Triage Vital Signs ED Triage Vitals  Encounter Vitals Group     BP 04/12/24 1129 125/82     Girls Systolic BP Percentile --      Girls Diastolic BP Percentile --      Boys Systolic BP Percentile --      Boys Diastolic BP Percentile --  Pulse Rate 04/12/24 1129 (!) 108     Resp --      Temp 04/12/24 1129 98 F (36.7 C)     Temp Source 04/12/24 1129 Oral     SpO2 04/12/24 1129 98 %     Weight --      Height --      Head Circumference --      Peak Flow --      Pain Score 04/12/24 1126 7     Pain Loc --      Pain Education --      Exclude from Growth Chart --    No data found.  Updated Vital Signs BP 125/82 (BP Location: Left Arm)   Pulse (!) 108   Temp 98 F (36.7 C) (Oral)   SpO2 98%   Visual Acuity Right Eye Distance:   Left Eye Distance:   Bilateral Distance:    Right Eye Near:   Left Eye Near:    Bilateral Near:     Physical Exam Vitals and nursing note reviewed.  Constitutional:      General: He is not in acute distress.    Appearance: Normal appearance. He is not ill-appearing.  HENT:     Head: Normocephalic and atraumatic.  Eyes:     Pupils: Pupils are equal, round, and  reactive to light.  Cardiovascular:     Rate and Rhythm: Normal rate.  Pulmonary:     Effort: Pulmonary effort is normal.  Musculoskeletal:       Feet:  Feet:     Comments: There is no swelling erythema or warmth of the plantar aspect of the bilateral feet.  No tenderness along plantar fascia.  There is tenderness to the distal 2nd and 3rd metatarsals.  No pain with flexion or extension of foot.  DP +2. Skin:    General: Skin is warm.  Neurological:     General: No focal deficit present.     Mental Status: He is alert and oriented to person, place, and time.  Psychiatric:        Mood and Affect: Mood normal.        Behavior: Behavior normal.      UC Treatments / Results  Labs (all labs ordered are listed, but only abnormal results are displayed) Labs Reviewed  GLUCOSE, POCT (MANUAL RESULT ENTRY) - Abnormal; Notable for the following components:      Result Value   POC Glucose 380 (*)    All other components within normal limits    EKG   Radiology No results found.  Procedures Procedures (including critical care time)  Medications Ordered in UC Medications - No data to display  Initial Impression / Assessment and Plan / UC Course  I have reviewed the triage vital signs and the nursing notes.  Pertinent labs & imaging results that were available during my care of the patient were reviewed by me and considered in my medical decision making (see chart for details).  Clinical Course as of 04/12/24 1220  Sat Apr 12, 2024  1216 Heart rate recheck 81 [JM]    Clinical Course User Index [JM] Loreda Myla SAUNDERS, NP    Reviewed exam and symptoms with patient.  Fingerstick blood sugar 380, patient reports he is fasting.  Denies polyuria, polydipsia or polyphasia.  No nausea vomiting, abdominal pain, altered mental status.   Did have nursing staff establish patient with a PCP locally we can follow-up for additional blood work and workup of his  diabetes.  He has an appointment in  2 days with his new PCP.  I discussed with him that this can also cause some of the symptoms he is describing.  Will do Rx naproxen twice daily as needed.    Strict ER precautions reviewed and patient verbalized understanding Final Clinical Impressions(s) / UC Diagnoses   Final diagnoses:  History of diabetes mellitus, type II  Type 2 diabetes mellitus with hyperglycemia, without long-term current use of insulin  (HCC)  Pain in both feet     Discharge Instructions      You may take naproxen twice daily as needed for your foot pain.  Continue inserts in your shoes to help cushion the feet as you do stand on hard surfaces.  Your blood sugar was elevated in the clinic please follow-up with your PCP at your scheduled appointment on December 1 for further evaluation and treatment.  Please go to the ER if you develop any worsening symptoms.  I hope you feel better soon!     ED Prescriptions     Medication Sig Dispense Auth. Provider   naproxen (NAPROSYN) 375 MG tablet Take 1 tablet (375 mg total) by mouth 2 (two) times daily as needed. 14 tablet Momen Ham, Jodi R, NP      PDMP not reviewed this encounter.   Loreda Myla SAUNDERS, NP 04/12/24 1218    Loreda Myla SAUNDERS, NP 04/12/24 1220

## 2024-04-14 ENCOUNTER — Ambulatory Visit: Payer: Self-pay | Admitting: Internal Medicine

## 2024-05-12 ENCOUNTER — Ambulatory Visit (INDEPENDENT_AMBULATORY_CARE_PROVIDER_SITE_OTHER): Payer: Self-pay | Admitting: Podiatry

## 2024-05-12 ENCOUNTER — Ambulatory Visit

## 2024-05-12 VITALS — Ht 66.0 in | Wt 207.0 lb

## 2024-05-12 DIAGNOSIS — M7742 Metatarsalgia, left foot: Secondary | ICD-10-CM

## 2024-05-12 DIAGNOSIS — M216X1 Other acquired deformities of right foot: Secondary | ICD-10-CM

## 2024-05-12 DIAGNOSIS — M7741 Metatarsalgia, right foot: Secondary | ICD-10-CM

## 2024-05-12 DIAGNOSIS — M216X2 Other acquired deformities of left foot: Secondary | ICD-10-CM

## 2024-05-12 DIAGNOSIS — M79672 Pain in left foot: Secondary | ICD-10-CM

## 2024-05-12 DIAGNOSIS — M79671 Pain in right foot: Secondary | ICD-10-CM

## 2024-05-12 MED ORDER — NAPROXEN 500 MG PO TABS: 500.0000 mg | ORAL_TABLET | Freq: Two times a day (BID) | ORAL | 0 refills | Status: DC

## 2024-05-14 NOTE — Progress Notes (Signed)
 "  Subjective:  Patient ID: Garrett Morse, male    DOB: [birth year],  MRN: [id number]  Chief Complaint  Patient presents with   Foot Pain    Rm 22 Patient is here for bilateral foot pain. Pain has been present for 2 months. Pain is located on the ball of the feet and radiates towards the 5th toes bilaterally. Pain is most intense in the mornings. Pain is a 7 on a scale of 1/10.    Discussed the use of AI scribe software for clinical note transcription with the patient, who gave verbal consent to proceed.  History of Present Illness Garrett Morse is a 38 year old male with bilateral metatarsalgia, metatarsus adductus, and pes planus who presents with worsening bilateral forefoot pain, more pronounced on the left.  He reports progressive pain localized to the plantar aspect of both forefeet, exacerbated by prolonged standing and ambulation on concrete surfaces at work while wearing steel-toed boots. The pain initially began in the right foot but is now more severe on the left.  He notes intermittent swelling at the left first metatarsophalangeal joint, which improves with elevation and periods of reduced activity. He denies preceding trauma or injury.  He describes tightness and soreness in the forefoot, with occasional joint crepitus. He denies shooting pain into the toes, numbness, or paresthesia. No pain is present in other joints.  He has trialed over-the-counter ibuprofen, acetaminophen, and naproxen  sodium with partial symptomatic relief and no gastrointestinal side effects. He also elevates his feet and limits activity to manage symptoms.      Objective:    Physical Exam VASCULAR: DP and PT pulse palpable. Foot is warm and well-perfused. Capillary fill time is brisk. DERMATOLOGIC: Normal skin turgor, texture, and temperature. No open lesions, rashes, or ulcerations. NEUROLOGIC: Normal sensation to light touch and pressure. No paresthesias on examination. ORTHOPEDIC: Diffuse  tenderness across forefoot with increased pain on palpation in second interspace. No palpable Mulder sign or pain with lateral compression.     No images are attached to the encounter.    Results Radiology Bilateral foot radiograph (05/12/2024): Metatarsus adductus and pes planus deformity; no fracture or stress fracture (Independently interpreted)  Foot orthotic casting Custom foot orthotic mold taken to patient's foot for fabrication of orthotics to redistribute plantar pressure and provide support.   Assessment:   1. Metatarsalgia of both feet   2. Pronation of left foot   3. Pronation of right foot      Plan:  Patient was evaluated and treated and all questions answered.  Assessment and Plan Assessment & Plan Metatarsalgia, bilateral Chronic bilateral forefoot pain consistent with metatarsalgia, exacerbated by prolonged standing on concrete floors and use of steel-toed boots. Symptoms are worsened by biomechanical factors including metatarsus adductus and pes planus. Radiographs revealed no acute fracture or stress injury. Symptoms have improved with rest and elevation. No indication for acute surgical intervention. - Reviewed x-rays, which showed no fracture or stress injury. - Discussed diagnosis and contributing biomechanical factors. - Prescribed naproxen  500 mg, to be taken once or twice daily as needed for pain control. - Recommended continuation of over-the-counter NSAIDs until prescription is filled. - Advised elevation and rest to reduce swelling and pain. - Obtained scan for custom orthotics to redistribute pressure and provide support. - Arranged orthotic fitting and pickup in one month. - Scheduled follow-up in three months to assess progress. - Instructed him to call if symptoms worsen, including increased redness, swelling, or inability to bear weight.  Metatarsus  adductus and pes planus, bilateral Congenital metatarsus adductus and pes planus deformity  bilaterally, more pronounced on the left, contributing to abnormal weight distribution and increased strain on the lesser metatarsals, predisposing to metatarsalgia. No surgical intervention indicated. - Discussed anatomical findings and his contribution to symptoms. - Recommended custom orthotics molded to his foot to provide arch support and redistribute pressure. - Provided education on the chronic nature of the deformity and the role of orthotics in long-term management.  Possible Morton's neuroma, right foot Possible Morton's neuroma in the right foot, suggested by localized pain in the second interspace without classic Mulder sign or significant nerve symptoms on exam. No indication for surgical intervention. Likely related to chronic inflammation and mechanical irritation. - Discussed the possibility of Morton's neuroma and its typical symptoms. - Reviewed conservative management, including use of orthotics and anti-inflammatory medications. - Discussed that steroid injection could be considered if symptoms persist or worsen, but not indicated at this time. - Advised to monitor for worsening or new symptoms such as increased shooting pain, numbness, or inability to bear weight, and to report these if he occurs.      No follow-ups on file.   "

## 2024-05-26 ENCOUNTER — Ambulatory Visit: Payer: Self-pay | Admitting: Internal Medicine

## 2024-05-26 VITALS — BP 122/80 | HR 102 | Temp 98.9°F | Ht 66.0 in | Wt 149.2 lb

## 2024-05-26 DIAGNOSIS — Z7985 Long-term (current) use of injectable non-insulin antidiabetic drugs: Secondary | ICD-10-CM | POA: Diagnosis not present

## 2024-05-26 DIAGNOSIS — M216X2 Other acquired deformities of left foot: Secondary | ICD-10-CM | POA: Insufficient documentation

## 2024-05-26 DIAGNOSIS — E1165 Type 2 diabetes mellitus with hyperglycemia: Secondary | ICD-10-CM | POA: Diagnosis not present

## 2024-05-26 DIAGNOSIS — M7742 Metatarsalgia, left foot: Secondary | ICD-10-CM | POA: Diagnosis not present

## 2024-05-26 DIAGNOSIS — M7741 Metatarsalgia, right foot: Secondary | ICD-10-CM

## 2024-05-26 DIAGNOSIS — M216X1 Other acquired deformities of right foot: Secondary | ICD-10-CM | POA: Insufficient documentation

## 2024-05-26 LAB — CBC WITH DIFFERENTIAL/PLATELET
Basophils Absolute: 0 K/uL (ref 0.0–0.1)
Basophils Relative: 0.8 % (ref 0.0–3.0)
Eosinophils Absolute: 0 K/uL (ref 0.0–0.7)
Eosinophils Relative: 0.9 % (ref 0.0–5.0)
HCT: 42.9 % (ref 39.0–52.0)
Hemoglobin: 14.5 g/dL (ref 13.0–17.0)
Lymphocytes Relative: 45.6 % (ref 12.0–46.0)
Lymphs Abs: 2.3 K/uL (ref 0.7–4.0)
MCHC: 33.8 g/dL (ref 30.0–36.0)
MCV: 84.9 fl (ref 78.0–100.0)
Monocytes Absolute: 0.3 K/uL (ref 0.1–1.0)
Monocytes Relative: 6 % (ref 3.0–12.0)
Neutro Abs: 2.4 K/uL (ref 1.4–7.7)
Neutrophils Relative %: 46.7 % (ref 43.0–77.0)
Platelets: 311 K/uL (ref 150.0–400.0)
RBC: 5.05 Mil/uL (ref 4.22–5.81)
RDW: 13 % (ref 11.5–15.5)
WBC: 5.1 K/uL (ref 4.0–10.5)

## 2024-05-26 LAB — COMPREHENSIVE METABOLIC PANEL WITH GFR
ALT: 25 U/L (ref 3–53)
AST: 22 U/L (ref 5–37)
Albumin: 4.3 g/dL (ref 3.5–5.2)
Alkaline Phosphatase: 94 U/L (ref 39–117)
BUN: 11 mg/dL (ref 6–23)
CO2: 31 meq/L (ref 19–32)
Calcium: 9.6 mg/dL (ref 8.4–10.5)
Chloride: 97 meq/L (ref 96–112)
Creatinine, Ser: 0.83 mg/dL (ref 0.40–1.50)
GFR: 111.86 mL/min
Glucose, Bld: 358 mg/dL — ABNORMAL HIGH (ref 70–99)
Potassium: 4.4 meq/L (ref 3.5–5.1)
Sodium: 135 meq/L (ref 135–145)
Total Bilirubin: 0.5 mg/dL (ref 0.2–1.2)
Total Protein: 7.9 g/dL (ref 6.0–8.3)

## 2024-05-26 LAB — TSH: TSH: 1.79 u[IU]/mL (ref 0.35–5.50)

## 2024-05-26 LAB — POCT GLYCOSYLATED HEMOGLOBIN (HGB A1C): Hemoglobin A1C: 14.3 % — AB (ref 4.0–5.6)

## 2024-05-26 LAB — MICROALBUMIN / CREATININE URINE RATIO
Creatinine,U: 41.3 mg/dL
Microalb Creat Ratio: 20.2 mg/g (ref 0.0–30.0)
Microalb, Ur: 0.8 mg/dL (ref 0.7–1.9)

## 2024-05-26 MED ORDER — TIRZEPATIDE 2.5 MG/0.5ML ~~LOC~~ SOAJ: SUBCUTANEOUS | 0 refills | Status: AC

## 2024-05-26 MED ORDER — FREESTYLE LIBRE 3 READER DEVI: 0 refills | Status: AC

## 2024-05-26 MED ORDER — METFORMIN HCL ER 500 MG PO TB24: 500.0000 mg | ORAL_TABLET | Freq: Two times a day (BID) | ORAL | 1 refills | Status: AC

## 2024-05-26 MED ORDER — FREESTYLE LIBRE 3 PLUS SENSOR MISC: 3 refills | Status: AC

## 2024-05-26 MED ORDER — TIRZEPATIDE 5 MG/0.5ML ~~LOC~~ SOAJ: 5.0000 mg | SUBCUTANEOUS | 1 refills | Status: AC

## 2024-05-26 NOTE — Progress Notes (Signed)
 " [facility] [address] [place] [address] Dept: [phone] Dept Fax: [phone]  New Patient Office Visit  Subjective:   Garrett Morse [birth year] 05/26/2024  Chief Complaint  Patient presents with   Establish Care    Blood sugar, feet pain     HPI: Garrett Morse presents today to establish care at [facility]. Introduced to publishing rights manager role and practice setting.  All questions answered.  Concerns: See below   Discussed the use of AI scribe software for clinical note transcription with the patient, who gave verbal consent to proceed.  History of Present Illness   Garrett Morse is a 39 year old male with type 2 diabetes who presents to establish care as a new patient.  He was diagnosed with type 2 diabetes in November 2025 after presenting to urgent care for foot pain, where a blood glucose level of 380 mg/dL was noted. He was not started on any diabetes medication at that time. His recent A1c is 14.3% in office today. He experiences fatigue and irritability. No constant hunger, thirst, frequent urination, or blurry vision.He has a history of obesity, previously weighing almost 300 pounds, but has since lost weight and is now at a healthy weight. He was previously active in the gym but has had recent lifestyle changes due to having a young child. At the time of being at a heavier weight, he did have type 2 diabetes, but this resolved after weight loss per patient.  He has a family history of diabetes in his mother and grandmother, and his mother also has pancreatitis.   He has chronic foot pain, particularly in the padding of his feet, with localized swelling. A foot doctor suggested a possible Morton's neuroma on the right foot and noted flat feet and metatarsus adductus. He was fitted for orthotics and prescribed naproxen , which he has run out of. He experiences numbness and  tingling when pressure is applied to the area of the neuroma.   Lab Results  Component Value Date   HGBA1C 14.3 (A) 05/26/2024     The following portions of the patient's history were reviewed and updated as appropriate: past medical history, past surgical history, family history, social history, allergies, medications, and problem list.   There are no active problems to display for this patient.  Past Medical History:  Diagnosis Date   Diabetes mellitus without complication (HCC)    Past Surgical History:  Procedure Laterality Date   CLOSED REDUCTION HAND FRACTURE Left    KNEE ARTHROPLASTY Right 2006   Family History  Problem Relation Age of Onset   Diabetes Mother    Diverticulosis Mother    Pancreatitis Mother    Hypertension Father    Healthy Brother    Cataracts Maternal Grandmother    Diabetes Maternal Grandmother    Hypertension Maternal Grandmother    Hypertension Maternal Grandfather    Congestive Heart Failure Maternal Grandfather    Current Medications[1] Allergies[2]  ROS: A complete ROS was performed with pertinent positives/negatives noted in the HPI. The remainder of the ROS are negative.   Objective:   Today's Vitals   05/26/24 1325  BP: 122/80  Pulse: (!) 102  Temp: 98.9 F (37.2 C)  TempSrc: Temporal  SpO2: 99%  Weight: 149 lb 3.2 oz (67.7 kg)  Height: 5' 6 (1.676 m)    GENERAL: Well-appearing, in NAD. Well nourished.  SKIN: Pink, warm and dry. No rash, lesion, ulceration, or ecchymoses.  NECK: Trachea midline. Full ROM w/o pain or tenderness. No lymphadenopathy. No thyromegaly or palpable masses.  RESPIRATORY: Chest wall symmetrical. Respirations even and non-labored. Breath sounds clear to auscultation bilaterally.  CARDIAC: S1, S2 present, regular rate and rhythm. Peripheral pulses 2+ bilaterally.  EXTREMITIES: Without clubbing, cyanosis, or edema.  NEUROLOGIC: No motor or sensory deficits. Steady, even gait. Sensory exam of the foot  is normal, tested with the monofilament. Good pulses, no lesions or ulcers, good peripheral pulses. PSYCH/MENTAL STATUS: Alert, oriented x 3. Cooperative, appropriate mood and affect.   Health Maintenance Due  Topic Date Due   OPHTHALMOLOGY EXAM  Never done   Hepatitis C Screening  Never done   Hepatitis B Vaccines 19-59 Average Risk (1 of 3 - 19+ 3-dose series) Never done   HPV VACCINES (1 - 3-dose SCDM series) Never done   Pneumococcal Vaccine (2 of 2 - PCV) 01/05/2019   FOOT EXAM  11/29/2019   Diabetic kidney evaluation - eGFR measurement  09/04/2020   Diabetic kidney evaluation - Urine ACR  12/04/2020    Results for orders placed or performed in visit on 05/26/24  POCT glycosylated hemoglobin (Hb A1C)  Result Value Ref Range   Hemoglobin A1C 14.3 (A) 4.0 - 5.6 %   HbA1c POC (<> result, manual entry)     HbA1c, POC (prediabetic range)     HbA1c, POC (controlled diabetic range)      Assessment & Plan:  Assessment and Plan    Type 2 diabetes mellitus A1c at 14% indicates poor glycemic control. Discussed risks of uncontrolled diabetes, including vision, kidney, and cardiovascular complications. - Prescribed metformin  500mg  extended release, one tablet twice daily. If intolerable, reduce to one tablet once daily. - Prescribed Mounjaro  2.5 mg once weekly for 4 weeks, then increase to 5 mg weekly. - Ordered blood work to check kidney function, thyroid level, and blood counts. - Ordered urine sample to check for proteinuria. - Recommended Freestyle Libre for continuous glucose monitoring. - Scheduled follow-up in 6 weeks to assess blood sugar control and medication efficacy.  Chronic right foot pain with possible Morton's neuroma Chronic right foot pain with swelling and numbness, possibly due to Morton's neuroma. Previous treatment with naproxen  has run out. Orthotics fitted, follow-up with foot specialist in 2 months.  - Consult foot specialist regarding continuation of  naproxen . - Continue with orthotics as prescribed by foot specialist.        Orders Placed This Encounter  Procedures   CBC with Differential/Platelet   Comprehensive metabolic panel with GFR   TSH   Microalbumin / creatinine urine ratio   POCT glycosylated hemoglobin (Hb A1C)   Meds ordered this encounter  Medications   tirzepatide  (MOUNJARO ) 2.5 MG/0.5ML Pen    Sig: Inject 2.5mg  into skin once weekly for 4 weeks. THEN increase to 5mg  once weekly.    Dispense:  2 mL    Refill:  0    Supervising Provider:   THOMPSON, AARON B [[id number]]   metFORMIN  (GLUCOPHAGE -XR) 500 MG 24 hr tablet    Sig: Take 1 tablet (500 mg total) by mouth 2 (two) times daily with a meal.    Dispense:  180 tablet    Refill:  1    Supervising Provider:   THOMPSON, AARON B [[id number]]   tirzepatide  (MOUNJARO ) 5 MG/0.5ML Pen    Sig: Inject 5 mg into the skin once a week. Start this AFTER completing the 2.5mg  dose for 4 weeks.    Dispense:  2 mL  Refill:  1    Supervising Provider:   THOMPSON, AARON B [[id number]]   Continuous Glucose Sensor (FREESTYLE LIBRE 3 PLUS SENSOR) MISC    Sig: Change sensor every 15 days.    Dispense:  6 each    Refill:  3    Supervising Provider:   SEBASTIAN BEVERLEY NOVAK [[id number]]   Continuous Glucose Receiver (FREESTYLE LIBRE 3 READER) DEVI    Sig: Use to check blood sugar up to 4 times a day.    Dispense:  1 each    Refill:  0    Supervising Provider:   SEBASTIAN BEVERLEY NOVAK [[id number]]    Return in about 6 weeks (around 07/07/2024) for Diabetes.   [name] Senters, FNP     [1]  Current Outpatient Medications:    Continuous Glucose Receiver (FREESTYLE LIBRE 3 READER) DEVI, Use to check blood sugar up to 4 times a day., Disp: 1 each, Rfl: 0   Continuous Glucose Sensor (FREESTYLE LIBRE 3 PLUS SENSOR) MISC, Change sensor every 15 days., Disp: 6 each, Rfl: 3   metFORMIN  (GLUCOPHAGE -XR) 500 MG 24 hr tablet, Take 1 tablet (500 mg total) by mouth 2 (two) times daily with a meal., Disp: 180  tablet, Rfl: 1   tirzepatide  (MOUNJARO ) 2.5 MG/0.5ML Pen, Inject 2.5mg  into skin once weekly for 4 weeks. THEN increase to 5mg  once weekly., Disp: 2 mL, Rfl: 0   tirzepatide  (MOUNJARO ) 5 MG/0.5ML Pen, Inject 5 mg into the skin once a week. Start this AFTER completing the 2.5mg  dose for 4 weeks., Disp: 2 mL, Rfl: 1 [2]  Allergies Allergen Reactions   Penicillins Hives   "

## 2024-05-28 ENCOUNTER — Ambulatory Visit: Payer: Self-pay | Admitting: Internal Medicine

## 2024-05-28 NOTE — Progress Notes (Signed)
 Hi Garrett Morse,  Your electrolytes, kidney function, liver function, and blood counts are all within normal range.  Urine sample looks good.  Thyroid level normal.  Continue with Mounjaro  and metformin  as prescribed.  Continue our scheduled follow-up visits for your diabetes.  [name]

## 2024-05-30 ENCOUNTER — Telehealth: Payer: Self-pay | Admitting: Podiatry

## 2024-05-30 NOTE — Telephone Encounter (Signed)
 Orthotics are in [place] called left message on VM for pt to call to schedule an appt for [facility].

## 2024-06-13 ENCOUNTER — Other Ambulatory Visit (HOSPITAL_COMMUNITY): Payer: Self-pay

## 2024-06-17 ENCOUNTER — Telehealth: Payer: Self-pay | Admitting: Pharmacy Technician

## 2024-06-17 ENCOUNTER — Other Ambulatory Visit (HOSPITAL_COMMUNITY): Payer: Self-pay

## 2024-06-17 NOTE — Progress Notes (Signed)
 PA request has been Received. New Encounter has been or will be created for follow up. For additional info see Pharmacy Prior Auth telephone encounter from 06/17/24.

## 2024-06-19 ENCOUNTER — Other Ambulatory Visit (HOSPITAL_COMMUNITY): Payer: Self-pay

## 2024-08-12 ENCOUNTER — Ambulatory Visit: Payer: Self-pay | Admitting: Podiatry
